# Patient Record
Sex: Male | Born: 1935 | Race: White | Hispanic: No | Marital: Married
Health system: Southern US, Community
[De-identification: ages and names within clinical notes are randomized; demographics above are authoritative.]

## PROBLEM LIST (undated history)

## (undated) DIAGNOSIS — G40909 Epilepsy, unspecified, not intractable, without status epilepticus: Secondary | ICD-10-CM

## (undated) DIAGNOSIS — I701 Atherosclerosis of renal artery: Secondary | ICD-10-CM

## (undated) DIAGNOSIS — I10 Essential (primary) hypertension: Secondary | ICD-10-CM

## (undated) DIAGNOSIS — E119 Type 2 diabetes mellitus without complications: Secondary | ICD-10-CM

## (undated) DIAGNOSIS — N183 Chronic kidney disease, stage 3 unspecified: Secondary | ICD-10-CM

## (undated) DIAGNOSIS — E785 Hyperlipidemia, unspecified: Secondary | ICD-10-CM

## (undated) DIAGNOSIS — Z794 Long term (current) use of insulin: Secondary | ICD-10-CM

## (undated) DIAGNOSIS — IMO0001 Reserved for inherently not codable concepts without codable children: Secondary | ICD-10-CM

## (undated) DIAGNOSIS — N400 Enlarged prostate without lower urinary tract symptoms: Secondary | ICD-10-CM

---

## 2005-05-07 ENCOUNTER — Ambulatory Visit: Payer: Self-pay | Admitting: Gastroenterology

## 2006-03-12 ENCOUNTER — Ambulatory Visit: Payer: Self-pay | Admitting: Internal Medicine

## 2006-04-06 ENCOUNTER — Ambulatory Visit: Payer: Self-pay | Admitting: Internal Medicine

## 2006-05-24 ENCOUNTER — Ambulatory Visit: Payer: Self-pay | Admitting: Urology

## 2006-06-07 ENCOUNTER — Ambulatory Visit: Payer: Self-pay | Admitting: Psychiatry

## 2007-02-10 ENCOUNTER — Inpatient Hospital Stay: Payer: Self-pay | Admitting: Internal Medicine

## 2007-02-10 ENCOUNTER — Other Ambulatory Visit: Payer: Self-pay

## 2007-05-09 ENCOUNTER — Ambulatory Visit: Payer: Self-pay | Admitting: Internal Medicine

## 2007-05-18 ENCOUNTER — Ambulatory Visit: Payer: Self-pay | Admitting: Vascular Surgery

## 2007-07-12 ENCOUNTER — Ambulatory Visit: Payer: Self-pay | Admitting: Internal Medicine

## 2012-02-20 ENCOUNTER — Inpatient Hospital Stay: Payer: Self-pay | Admitting: Internal Medicine

## 2012-02-20 ENCOUNTER — Ambulatory Visit: Payer: Self-pay | Admitting: Urology

## 2012-02-20 LAB — URINALYSIS, COMPLETE
Bacteria: NONE SEEN
Leukocyte Esterase: NEGATIVE
Nitrite: NEGATIVE
Ph: 5 (ref 4.5–8.0)
RBC,UR: 46 /HPF (ref 0–5)
Squamous Epithelial: NONE SEEN

## 2012-02-20 LAB — CBC
HGB: 14.9 g/dL (ref 13.0–18.0)
MCV: 90 fL (ref 80–100)
RDW: 13.4 % (ref 11.5–14.5)

## 2012-02-20 LAB — COMPREHENSIVE METABOLIC PANEL
Albumin: 3.9 g/dL (ref 3.4–5.0)
Alkaline Phosphatase: 98 U/L (ref 50–136)
Anion Gap: 9 (ref 7–16)
BUN: 57 mg/dL — ABNORMAL HIGH (ref 7–18)
Bilirubin,Total: 0.9 mg/dL (ref 0.2–1.0)
Calcium, Total: 8.9 mg/dL (ref 8.5–10.1)
Chloride: 98 mmol/L (ref 98–107)
EGFR (Non-African Amer.): 31 — ABNORMAL LOW
Glucose: 336 mg/dL — ABNORMAL HIGH (ref 65–99)
Potassium: 4.5 mmol/L (ref 3.5–5.1)
Sodium: 135 mmol/L — ABNORMAL LOW (ref 136–145)
Total Protein: 8 g/dL (ref 6.4–8.2)

## 2012-02-20 LAB — TROPONIN I
Troponin-I: 0.08 ng/mL — ABNORMAL HIGH
Troponin-I: 0.07 ng/mL — ABNORMAL HIGH

## 2012-02-21 LAB — CBC WITH DIFFERENTIAL/PLATELET
Basophil #: 0 10*3/uL (ref 0.0–0.1)
Basophil %: 0.4 %
Eosinophil #: 0 10*3/uL (ref 0.0–0.7)
Eosinophil %: 0.3 %
HGB: 11.4 g/dL — ABNORMAL LOW (ref 13.0–18.0)
Lymphocyte #: 0.3 10*3/uL — ABNORMAL LOW (ref 1.0–3.6)
Lymphocyte %: 5.1 %
MCH: 31.5 pg (ref 26.0–34.0)
MCHC: 35.1 g/dL (ref 32.0–36.0)
MCV: 90 fL (ref 80–100)
Monocyte %: 11.7 %
Neutrophil #: 5 10*3/uL (ref 1.4–6.5)
Neutrophil %: 82.5 %
RBC: 3.63 10*6/uL — ABNORMAL LOW (ref 4.40–5.90)
RDW: 13.7 % (ref 11.5–14.5)
WBC: 6.1 10*3/uL (ref 3.8–10.6)

## 2012-02-21 LAB — LIPID PANEL
Cholesterol: 172 mg/dL (ref 0–200)
HDL Cholesterol: 32 mg/dL — ABNORMAL LOW (ref 40–60)
Ldl Cholesterol, Calc: 92 mg/dL (ref 0–100)
Triglycerides: 238 mg/dL — ABNORMAL HIGH (ref 0–200)
VLDL Cholesterol, Calc: 48 mg/dL — ABNORMAL HIGH (ref 5–40)

## 2012-02-21 LAB — CK TOTAL AND CKMB (NOT AT ARMC)
CK, Total: 187 U/L (ref 35–232)
CK, Total: 201 U/L (ref 35–232)
CK-MB: 1.8 ng/mL (ref 0.5–3.6)

## 2012-02-21 LAB — BASIC METABOLIC PANEL
Chloride: 105 mmol/L (ref 98–107)
EGFR (African American): 33 — ABNORMAL LOW
Glucose: 198 mg/dL — ABNORMAL HIGH (ref 65–99)
Osmolality: 298 (ref 275–301)

## 2012-02-22 LAB — CBC WITH DIFFERENTIAL/PLATELET
Basophil #: 0 10*3/uL (ref 0.0–0.1)
Basophil %: 0.6 %
Eosinophil #: 0.1 10*3/uL (ref 0.0–0.7)
Eosinophil %: 2.1 %
HCT: 30.2 % — ABNORMAL LOW (ref 40.0–52.0)
HGB: 10.8 g/dL — ABNORMAL LOW (ref 13.0–18.0)
Lymphocyte #: 0.3 10*3/uL — ABNORMAL LOW (ref 1.0–3.6)
Lymphocyte %: 7 %
MCH: 32.2 pg (ref 26.0–34.0)
MCV: 90 fL (ref 80–100)
Monocyte #: 0.5 x10 3/mm (ref 0.2–1.0)
Neutrophil %: 78.3 %
RDW: 13.6 % (ref 11.5–14.5)

## 2012-02-22 LAB — BASIC METABOLIC PANEL
BUN: 38 mg/dL — ABNORMAL HIGH (ref 7–18)
Chloride: 109 mmol/L — ABNORMAL HIGH (ref 98–107)
EGFR (African American): 56 — ABNORMAL LOW
EGFR (Non-African Amer.): 49 — ABNORMAL LOW
Glucose: 179 mg/dL — ABNORMAL HIGH (ref 65–99)
Potassium: 3.5 mmol/L (ref 3.5–5.1)
Sodium: 141 mmol/L (ref 136–145)

## 2012-02-23 LAB — URINE CULTURE

## 2012-02-26 LAB — CULTURE, BLOOD (SINGLE)

## 2012-03-03 ENCOUNTER — Ambulatory Visit: Payer: Self-pay | Admitting: Urology

## 2012-03-07 ENCOUNTER — Ambulatory Visit: Payer: Self-pay | Admitting: Urology

## 2013-08-04 ENCOUNTER — Inpatient Hospital Stay: Payer: Self-pay | Admitting: Internal Medicine

## 2013-08-04 LAB — CBC WITH DIFFERENTIAL/PLATELET
Basophil #: 0 10*3/uL (ref 0.0–0.1)
HCT: 33.4 % — ABNORMAL LOW (ref 40.0–52.0)
HGB: 11.8 g/dL — ABNORMAL LOW (ref 13.0–18.0)
Lymphocyte #: 0.3 10*3/uL — ABNORMAL LOW (ref 1.0–3.6)
MCH: 31.3 pg (ref 26.0–34.0)
MCV: 89 fL (ref 80–100)
Monocyte %: 16.3 %
Platelet: 137 10*3/uL — ABNORMAL LOW (ref 150–440)
RBC: 3.77 10*6/uL — ABNORMAL LOW (ref 4.40–5.90)
WBC: 4.7 10*3/uL (ref 3.8–10.6)

## 2013-08-04 LAB — URINALYSIS, COMPLETE
Bacteria: NONE SEEN
Blood: NEGATIVE
Glucose,UR: NEGATIVE mg/dL (ref 0–75)
Leukocyte Esterase: NEGATIVE
Ph: 5 (ref 4.5–8.0)
Protein: >=500
RBC,UR: 3 /HPF (ref 0–5)
Specific Gravity: 1.02 (ref 1.003–1.030)
Squamous Epithelial: <1
WBC UR: 7 /HPF (ref 0–5)

## 2013-08-04 LAB — COMPREHENSIVE METABOLIC PANEL
Albumin: 3.3 g/dL — ABNORMAL LOW (ref 3.4–5.0)
Alkaline Phosphatase: 104 U/L (ref 50–136)
Anion Gap: 8 (ref 7–16)
BUN: 30 mg/dL — ABNORMAL HIGH (ref 7–18)
Bilirubin,Total: 0.7 mg/dL (ref 0.2–1.0)
Calcium, Total: 8.6 mg/dL (ref 8.5–10.1)
Chloride: 104 mmol/L (ref 98–107)
Creatinine: 1.71 mg/dL — ABNORMAL HIGH (ref 0.60–1.30)
EGFR (African American): 42 — ABNORMAL LOW
Glucose: 201 mg/dL — ABNORMAL HIGH (ref 65–99)
Osmolality: 284 (ref 275–301)
Potassium: 4.7 mmol/L (ref 3.5–5.1)
SGPT (ALT): 27 U/L (ref 12–78)
Total Protein: 6.8 g/dL (ref 6.4–8.2)

## 2013-08-04 LAB — PROTIME-INR
INR: 1.1
Prothrombin Time: 14.4 secs (ref 11.5–14.7)

## 2013-08-04 LAB — TROPONIN I: Troponin-I: <0.02 ng/mL

## 2013-08-05 LAB — CBC WITH DIFFERENTIAL/PLATELET
Eosinophil %: 0.7 %
HCT: 32.8 % — ABNORMAL LOW (ref 40.0–52.0)
HGB: 11.7 g/dL — ABNORMAL LOW (ref 13.0–18.0)
Lymphocyte %: 8.3 %
MCHC: 35.5 g/dL (ref 32.0–36.0)
MCV: 88 fL (ref 80–100)
Monocyte #: 0.6 x10 3/mm (ref 0.2–1.0)
Monocyte %: 14.6 %
Neutrophil #: 3 10*3/uL (ref 1.4–6.5)
Neutrophil %: 75.9 %
Platelet: 146 10*3/uL — ABNORMAL LOW (ref 150–440)
RBC: 3.75 10*6/uL — ABNORMAL LOW (ref 4.40–5.90)
RDW: 13.7 % (ref 11.5–14.5)
WBC: 3.9 10*3/uL (ref 3.8–10.6)

## 2013-08-05 LAB — BASIC METABOLIC PANEL
Anion Gap: 8 (ref 7–16)
BUN: 29 mg/dL — ABNORMAL HIGH (ref 7–18)
Calcium, Total: 8.6 mg/dL (ref 8.5–10.1)
Chloride: 104 mmol/L (ref 98–107)
EGFR (African American): 45 — ABNORMAL LOW
EGFR (Non-African Amer.): 38 — ABNORMAL LOW
Glucose: 73 mg/dL (ref 65–99)
Osmolality: 278 (ref 275–301)
Potassium: 3.7 mmol/L (ref 3.5–5.1)
Sodium: 137 mmol/L (ref 136–145)

## 2013-08-09 LAB — CULTURE, BLOOD (SINGLE)

## 2014-02-12 ENCOUNTER — Inpatient Hospital Stay: Payer: Self-pay | Admitting: Internal Medicine

## 2014-02-12 LAB — CBC WITH DIFFERENTIAL/PLATELET
BANDS NEUTROPHIL: 3 %
BASOS ABS: 1 %
EOS PCT: 3 %
HCT: 25.7 % — ABNORMAL LOW (ref 40.0–52.0)
HGB: 8.3 g/dL — ABNORMAL LOW (ref 13.0–18.0)
Lymphocytes: 4 %
MCH: 31.5 pg (ref 26.0–34.0)
MCHC: 32.5 g/dL (ref 32.0–36.0)
MCV: 97 fL (ref 80–100)
Monocytes: 2 %
Platelet: 191 10*3/uL (ref 150–440)
RBC: 2.65 10*6/uL — ABNORMAL LOW (ref 4.40–5.90)
RDW: 17.3 % — ABNORMAL HIGH (ref 11.5–14.5)
Segmented Neutrophils: 87 %
WBC: 5 10*3/uL (ref 3.8–10.6)

## 2014-02-12 LAB — URINALYSIS, COMPLETE
Bacteria: NONE SEEN
Bilirubin,UR: NEGATIVE
Blood: NEGATIVE
GLUCOSE, UR: NEGATIVE mg/dL (ref 0–75)
Ketone: NEGATIVE
Leukocyte Esterase: NEGATIVE
Nitrite: NEGATIVE
Ph: 5 (ref 4.5–8.0)
Protein: 100
RBC,UR: 4 /HPF (ref 0–5)
Specific Gravity: 1.016 (ref 1.003–1.030)
WBC UR: 1 /HPF (ref 0–5)

## 2014-02-12 LAB — COMPREHENSIVE METABOLIC PANEL
ALBUMIN: 3.5 g/dL (ref 3.4–5.0)
ANION GAP: 6 — AB (ref 7–16)
Alkaline Phosphatase: 107 U/L
BUN: 29 mg/dL — AB (ref 7–18)
Bilirubin,Total: 0.7 mg/dL (ref 0.2–1.0)
Calcium, Total: 8.8 mg/dL (ref 8.5–10.1)
Chloride: 111 mmol/L — ABNORMAL HIGH (ref 98–107)
Co2: 25 mmol/L (ref 21–32)
Creatinine: 1.77 mg/dL — ABNORMAL HIGH (ref 0.60–1.30)
EGFR (African American): 40 — ABNORMAL LOW
GFR CALC NON AF AMER: 34 — AB
GLUCOSE: 124 mg/dL — AB (ref 65–99)
Osmolality: 290 (ref 275–301)
Potassium: 4.3 mmol/L (ref 3.5–5.1)
SGOT(AST): 19 U/L (ref 15–37)
SGPT (ALT): 26 U/L (ref 12–78)
Sodium: 142 mmol/L (ref 136–145)
TOTAL PROTEIN: 6.9 g/dL (ref 6.4–8.2)

## 2014-02-12 LAB — HEMOGLOBIN A1C: Hemoglobin A1C: 5.7 % (ref 4.2–6.3)

## 2014-02-13 LAB — BASIC METABOLIC PANEL
ANION GAP: 4 — AB (ref 7–16)
BUN: 29 mg/dL — AB (ref 7–18)
CALCIUM: 8.3 mg/dL — AB (ref 8.5–10.1)
CHLORIDE: 110 mmol/L — AB (ref 98–107)
CO2: 26 mmol/L (ref 21–32)
CREATININE: 1.67 mg/dL — AB (ref 0.60–1.30)
EGFR (African American): 43 — ABNORMAL LOW
EGFR (Non-African Amer.): 37 — ABNORMAL LOW
Glucose: 86 mg/dL (ref 65–99)
OSMOLALITY: 285 (ref 275–301)
Potassium: 4.6 mmol/L (ref 3.5–5.1)
Sodium: 140 mmol/L (ref 136–145)

## 2014-02-13 LAB — CBC WITH DIFFERENTIAL/PLATELET
Basophil #: 0 10*3/uL (ref 0.0–0.1)
Basophil %: 0.4 %
EOS ABS: 0.2 10*3/uL (ref 0.0–0.7)
Eosinophil %: 3.3 %
HCT: 24.1 % — AB (ref 40.0–52.0)
HGB: 8.2 g/dL — ABNORMAL LOW (ref 13.0–18.0)
LYMPHS PCT: 3.7 %
Lymphocyte #: 0.2 10*3/uL — ABNORMAL LOW (ref 1.0–3.6)
MCH: 33 pg (ref 26.0–34.0)
MCHC: 34 g/dL (ref 32.0–36.0)
MCV: 97 fL (ref 80–100)
MONO ABS: 0.5 x10 3/mm (ref 0.2–1.0)
MONOS PCT: 8.5 %
Neutrophil #: 5.1 10*3/uL (ref 1.4–6.5)
Neutrophil %: 84.1 %
Platelet: 163 10*3/uL (ref 150–440)
RBC: 2.48 10*6/uL — AB (ref 4.40–5.90)
RDW: 16.7 % — ABNORMAL HIGH (ref 11.5–14.5)
WBC: 6 10*3/uL (ref 3.8–10.6)

## 2014-02-14 LAB — BASIC METABOLIC PANEL
Anion Gap: 6 — ABNORMAL LOW (ref 7–16)
BUN: 40 mg/dL — AB (ref 7–18)
CALCIUM: 7.6 mg/dL — AB (ref 8.5–10.1)
Chloride: 108 mmol/L — ABNORMAL HIGH (ref 98–107)
Co2: 23 mmol/L (ref 21–32)
Creatinine: 1.91 mg/dL — ABNORMAL HIGH (ref 0.60–1.30)
EGFR (African American): 36 — ABNORMAL LOW
EGFR (Non-African Amer.): 31 — ABNORMAL LOW
Glucose: 150 mg/dL — ABNORMAL HIGH (ref 65–99)
OSMOLALITY: 286 (ref 275–301)
Potassium: 4.3 mmol/L (ref 3.5–5.1)
SODIUM: 137 mmol/L (ref 136–145)

## 2014-02-14 LAB — CBC WITH DIFFERENTIAL/PLATELET
Basophil #: 0 10*3/uL (ref 0.0–0.1)
Basophil %: 0.3 %
EOS ABS: 0.1 10*3/uL (ref 0.0–0.7)
EOS PCT: 2.5 %
HCT: 20.3 % — ABNORMAL LOW (ref 40.0–52.0)
HGB: 6.9 g/dL — AB (ref 13.0–18.0)
LYMPHS PCT: 3.4 %
Lymphocyte #: 0.2 10*3/uL — ABNORMAL LOW (ref 1.0–3.6)
MCH: 32.9 pg (ref 26.0–34.0)
MCHC: 33.7 g/dL (ref 32.0–36.0)
MCV: 97 fL (ref 80–100)
MONOS PCT: 9.9 %
Monocyte #: 0.6 x10 3/mm (ref 0.2–1.0)
NEUTROS ABS: 4.7 10*3/uL (ref 1.4–6.5)
Neutrophil %: 83.9 %
PLATELETS: 131 10*3/uL — AB (ref 150–440)
RBC: 2.09 10*6/uL — ABNORMAL LOW (ref 4.40–5.90)
RDW: 16.1 % — ABNORMAL HIGH (ref 11.5–14.5)
WBC: 5.6 10*3/uL (ref 3.8–10.6)

## 2014-02-14 LAB — URINE CULTURE

## 2014-02-15 LAB — HEMOGLOBIN: HGB: 7.5 g/dL — AB (ref 13.0–18.0)

## 2014-02-16 ENCOUNTER — Encounter: Payer: Self-pay | Admitting: Internal Medicine

## 2014-02-22 LAB — CBC WITH DIFFERENTIAL/PLATELET
BASOS PCT: 0.8 %
Basophil #: 0 10*3/uL (ref 0.0–0.1)
Eosinophil #: 0.1 10*3/uL (ref 0.0–0.7)
Eosinophil %: 4.7 %
HCT: 26.6 % — ABNORMAL LOW (ref 40.0–52.0)
HGB: 8.7 g/dL — AB (ref 13.0–18.0)
LYMPHS PCT: 8.3 %
Lymphocyte #: 0.2 10*3/uL — ABNORMAL LOW (ref 1.0–3.6)
MCH: 30.4 pg (ref 26.0–34.0)
MCHC: 32.7 g/dL (ref 32.0–36.0)
MCV: 93 fL (ref 80–100)
MONO ABS: 0.3 x10 3/mm (ref 0.2–1.0)
MONOS PCT: 14.4 %
NEUTROS PCT: 71.8 %
Neutrophil #: 1.7 10*3/uL (ref 1.4–6.5)
Platelet: 159 10*3/uL (ref 150–440)
RBC: 2.87 10*6/uL — ABNORMAL LOW (ref 4.40–5.90)
RDW: 17.2 % — AB (ref 11.5–14.5)
WBC: 2.4 10*3/uL — ABNORMAL LOW (ref 3.8–10.6)

## 2014-02-22 LAB — BASIC METABOLIC PANEL
Anion Gap: 6 — ABNORMAL LOW (ref 7–16)
BUN: 31 mg/dL — AB (ref 7–18)
CO2: 27 mmol/L (ref 21–32)
CREATININE: 1.52 mg/dL — AB (ref 0.60–1.30)
Calcium, Total: 8.7 mg/dL (ref 8.5–10.1)
Chloride: 108 mmol/L — ABNORMAL HIGH (ref 98–107)
EGFR (Non-African Amer.): 41 — ABNORMAL LOW
GFR CALC AF AMER: 48 — AB
GLUCOSE: 82 mg/dL (ref 65–99)
Osmolality: 287 (ref 275–301)
Potassium: 4.2 mmol/L (ref 3.5–5.1)
SODIUM: 141 mmol/L (ref 136–145)

## 2014-02-22 LAB — HEMOGLOBIN A1C: HEMOGLOBIN A1C: 5.2 % (ref 4.2–6.3)

## 2014-02-23 ENCOUNTER — Encounter: Payer: Self-pay | Admitting: Internal Medicine

## 2015-02-15 NOTE — H&P (Signed)
PATIENT NAME:  Juan Beck, Juan Beck MR#:  [id number] DATE OF BIRTH:  Jan 06, 1926  DATE OF ADMISSION:  08/04/2013  PRIMARY DOCTOR:  Dr. Frazier Richards.  ER PHYSICIAN:  Dr. Lurline Hare.   CHIEF COMPLAINT:  Dizziness.   HISTORY OF PRESENT ILLNESS: The patient is an 81 year old male patient who came in because of a syncopal episode. The patient had a tooth extraction from the right lower molar 2 days ago. The patient noticed a worsening right-sided facial swelling so he was given amoxicillin. , when he had like an episode of fall last night, associated with generalized weakness. The wife could not make him stand and the patient had dizziness. Because of the weakness, dizziness and also near syncopal episode, the patient's family brought him here. In the ER, the patient was orthostatic. The patient had orthostatic hypotension changes. Blood pressure dropped to 70/40s when he stood up, and while sitting, his blood pressure was 123/62 so the ER physician recommended admission because of a facial cellulitis and orthostatic hypotension. Because of his history of a stent and he is on aspirin, Plavix, if he goes home and if he falls, he can have intracranial bleed, and then because of that and also dehydration with cellulitis, we are going to admit. The patient really feels better than when he came and he wants to go home, but he still had orthostatic changes. According to family this not new, he does have orthostatic hypotension before, never seeked medical attention for that.   PAST MEDICAL HISTORY:  Significant for hypertension, diabetes, and  history of renal artery stent placement.   ALLERGIES:  No known allergies.   SOCIAL HISTORY:  No smoking. No drinking. Lives with the wife.   FAMILY HISTORY:  Significant for hypertension two brothers and sisters.   PAST SURGICAL HISTORY:  Significant for renal artery stent placement and also a history of ureteral stent placement. The patient was admitted in May of last  year and had a ureteral stent placement for hydronephrosis and also ureteral canaliculi. The patient had left-sided ureteral lithiasis so he had cystoscopy and Dr. Allen Norris saw the patient at that time.   MEDICATIONS:  He is on:  1.  Amlodipine 10 mg daily.  2.  Aspirin 81 mg daily.  3.  Dilantin 100 mg, 2 tablets at bedtime.  4.  Levophed 5 mg p.o. b.i.d.  5.  Labetalol 300 mg p.o. b.i.d.  6.  Lantus 30 units at bedtime.  7.  Metformin 1 gram p.o. b.i.d.  8.  Plavix 75 mg p.o. daily.   REVIEW OF SYSTEMS:  CONSTITUTIONAL:  Had a low-grade temp this morning and he   but feels better now.  EYES:  No blurred vision.  ENT:  No tinnitus. No epistaxis. The patient has some difficulty with the swallow because of the tooth extraction. The patient also has some gum pain in the right lower molar area.  RESPIRATORY:  No cough. No wheezing.  CARDIOVASCULAR:  No chest pain. No orthopnea.  GASTROINTESTINAL:  No nausea. No vomiting. No abdominal pain.  GENITOURINARY:  No dysuria.  HEMATOLOGIC:   No anemia.  SKIN AND MUSCULOSKELETAL:  No joint pain.  NEUROLOGIC:  No numbness or weakness.  PSYCHIATRIC:  No anxiety or insomnia.   PHYSICAL EXAMINATION:  VITAL SIGNS:  Temperature 99.2, heart rate 73, blood pressure 123/62, Orthostatic vitals: 77/40 when he stood up.  GENERAL:  Awake, alert, oriented. Answering questions appropriately. HEENT:  Normocephalic. Pupils equally reacting to light. Extraocular movements are intact.  NOSE:  No turbinate hypertrophy.  EARS:  No tympanic membrane congestion. MOUTH:  The patient has very poor dentition and also extracted place in the right molar area, looks clean. No obvious swelling and no obvious tenderness observed.  NECK:  Supple. No JVD. Normal range of motion.  RESPIRATORY:  Clear to auscultation. No wheeze. No rales.  CARDIOVASCULAR:  S1, S2 regular. No murmurs and the patient's pulses are equal in femoral and dorsalis pedis. No peripheral edema.   GASTROINTESTINAL:  Abdomen is soft, nontender, nondistended. Bowel sounds present. No organomegaly,  MUSCULOSKELETAL:  Strength 5/5 in upper and lower extremities.  NEUROLOGIC:  Alert, awake, oriented. Cranial nerves II through XII intact. Power 5/5 in upper and lower extremities.  PSYCHIATRIC:  Mood and affect are within normal limits.   LABORATORY, DIAGNOSTIC AND RADIOLOGICAL DATA:  CT of the maxillofacial area shows right facial tissue swelling overlying the maxilla without any abscess.   X-ray of the right ankle showed no acute changes. Right knee x-ray degenerative changes. UA is amber in color urine.  CT head shows no acute intracranial abnormalities.   WBC 4.7, hemoglobin 11.8, hematocrit 33.4, platelets 137.   ELECTROLYTES:  Sodium is 136, potassium 4.7, chloride 104, bicarb 24, BUN 30, creatinine 1.7 and glucose 201.  INR is 1.1. Dilantin is 4.1.   ASSESSMENT AND PLAN:  This is an 81 year old male with an episode of syncope secondary to orthostatic hypotension, acute renal failure.  1.  Admit to the hospitalist service. Continue IV fluids and monitor overnight.  2.  Seizure disorder. Continue Dilantin.  3.  A history of hypertension. Continue amlodipine and labetalol. Orthostatic vitals to be checked.  4.  Diabetes mellitus type 2. Continue Lantus and metformin. Watch creatinine closely.  5.  A history of renal artery stent placement. He is on Plavix and aspirin. Continue that.  6.  Right facial cellulitis. The patient will get IV Zosyn today, but probably can go home with Levaquin tomorrow, and watch for temperature curve and transfer service to Dr. Frazier Richards.   TIME SPENT:  More than 55 minutes.   ____________________________ Epifanio Lesches, MD sk:jm D: 08/04/2013 17:26:56 ET T: 08/04/2013 17:55:27 ET JOB#: [id number]  cc: Epifanio Lesches, MD, <Dictator> Epifanio Lesches MD ELECTRONICALLY SIGNED 09/07/2013 22:39

## 2015-02-15 NOTE — Discharge Summary (Signed)
PATIENT NAME:  Juan Beck, Juan Beck MR#:  [id number] DATE OF BIRTH:  06-03-1926  DATE OF ADMISSION:  08/04/2013 DATE OF DISCHARGE:  08/08/2013  DISCHARGE DIAGNOSES: 1. Facial cellulitis.  2. Herpes zoster.  3. Type 2 diabetes.  4. Recent dental procedure.  5. Hypertension with orthostatic changes.   DISCHARGE MEDICATIONS: Per [facility] med reconciliation, please see that sheet that was completed for details. Basically, his amlodipine was stopped. His labetalol was decreased to 100 t.i.d. and he was given Augmentin and famir for a week.   HISTORY AND PHYSICAL: Please see detailed history and physical on admission.   HOSPITAL COURSE: The patient was admitted with a rash that is tender and swollen on the right side of face. He had dental procedures done fairly recently. There was some vesicular component to this and a mild amount of pain. He was treated with IV Zosyn and Famvir was started. With this treatment he improved. Minimal erythema at this point, certainly in intermittent areas at this point as well. Further evaluation showed some controlled blood glucose from 60 to approximately 160, not always fasting. He has stable creatinine at approximately 1.6. His CBC was relatively normal. Head CT was done on admission that showed no acute processes. Maxillofacial CT was also done, it did not show a loculated mass, but showed some soft tissue swelling. Chest x-ray showed no acute disease as well. Blood cultures were negative. Troponins were normal. He will discharge home. Will follow up soon to re-evaluate his facial lesion. He knows to call for any details. He was much less dizzy with a higher blood pressure, had been falling and off balance on his regimen that controlled that. We will have to let him run high given, likely, diffuse atherosclerosis and orthostatic changes and inability to correctly measure his true intra-arterial blood pressure given all that.   TIME SPENT: Approximately 35 minutes doing all  his discharge tasks today.   ____________________________ Ocie Cornfield. Ouida Sills, MD mwa:sg D: 08/07/2013 08:12:04 ET T: 08/07/2013 08:36:17 ET JOB#: [id number]  cc: Ocie Cornfield. Ouida Sills, MD, <Dictator> Kirk Ruths MD ELECTRONICALLY SIGNED 08/07/2013 16:48

## 2015-02-16 NOTE — Op Note (Signed)
PATIENT NAME:  Juan Beck, Juan Beck MR#:  [id number] DATE OF BIRTH:  Mar 02, 1926  DATE OF PROCEDURE:  02/13/2014  PREOPERATIVE DIAGNOSIS: Hairline fracture  right femoral neck.   PREOPERATIVE DIAGNOSIS: Hairline fracture  right femoral neck.   OPERATION: Percutaneous pinning of the right femoral neck fracture.   SURGEON: Park Breed, M.D.   ANESTHESIA: General endotracheal.   COMPLICATIONS: None.   DRAINS: None.   ESTIMATED BLOOD LOSS: Minimal.   REPLACED: None.   DESCRIPTION OF PROCEDURE: The patient was brought to the Operating Room where he underwent satisfactory general endotracheal anesthesia in the supine position on a fracture table. Due to the fact the patient takes Plavix a spinal could not be done. The left leg was flexed and abducted. The right leg was placed in gentle internal rotation. Fluoroscopy showed the fracture  remained in excellent alignment. The hip was prepped and draped in sterile fashion, and four stab wounds were made.  Four guide pins were inserted into the head and neck to appropriate the depth in the head. The  lateral cortex was then drilled for these and a 90 mm, 95 mm and two 100 mm 7.3 cannulated Synthes screws were inserted to fix the fracture securely. The pins were removed. Fluoroscopy showed good positioning of all the hardware. The stab wounds were closed with 3-0 nylon sutures. Dry sterile dressing was applied. The patient was taken out of traction, had good range of motion. He was transferred to his hospital bed and taken to recovery in good condition.    ____________________________ Park Breed, MD hem:sg D: 02/13/2014 14:14:43 ET T: 02/13/2014 14:31:47 ET JOB#: [id number]  cc: Park Breed, MD, <Dictator> Park Breed MD ELECTRONICALLY SIGNED 02/13/2014 16:19

## 2015-02-16 NOTE — Discharge Summary (Signed)
Dates of Admission and Diagnosis:  Date of Admission 12-Feb-2014   Date of Discharge 16-Feb-2014   Admitting Diagnosis hip pain   Final Diagnosis same and htn   Discharge Diagnosis 1 arf on ckd III    Chief Complaint/History of Present Illness per h and p   Allergies:  No Known Allergies:   Hepatic:  20-Apr-15 08:08   Bilirubin, Total 0.7  Alkaline Phosphatase 107 (45-117 NOTE: New Reference Range 09/15/13)  SGPT (ALT) 26  SGOT (AST) 19  Total Protein, Serum 6.9  Albumin, Serum 3.5  Routine BB:  20-Apr-15 08:08   ABO Group + Rh Type O Positive  Antibody Screen NEGATIVE (Result(s) reported on 14 Feb 2014 at 10:00AM.)  Crossmatch Unit 1 Transfused  Result(s) reported on 15 Feb 2014 at 07:14AM.  Routine Micro:  20-Apr-15 17:26   Micro Text Report URINE CULTURE   COMMENT                   NO GROWTH IN 36 HOURS   ANTIBIOTIC                       Specimen Source CLEAN CATCH  Culture Comment NO GROWTH IN 36 HOURS  Result(s) reported on 14 Feb 2014 at 09:59AM.  Cardiology:  20-Apr-15 08:08   Ventricular Rate 64  Atrial Rate 64  P-R Interval 146  QRS Duration 94  QT 448  QTc 462  P Axis 83  R Axis 19  T Axis 58  ECG interpretation Normal sinus rhythm Normal ECG When compared with ECG of 04-Aug-2013 15:22, Nonspecific T wave abnormality no longer evident in Anterior leads ----------unconfirmed---------- Confirmed by OVERREAD, NOT ([id number]), editor PEARSON, BARBARA ([id number]) on 02/14/2014 8:39:57 AM  Routine Chem:  20-Apr-15 08:08   Glucose, Serum  124  BUN  29  Creatinine (comp)  1.77  Sodium, Serum 142  Potassium, Serum 4.3  Chloride, Serum  111  CO2, Serum 25  Calcium (Total), Serum 8.8  Anion Gap  6  Osmolality (calc) 290  eGFR (African American)  40  eGFR (Non-African American)  34 (eGFR values <85mL/min/1.73 m2 may be an indication of chronic kidney disease (CKD). Calculated eGFR is useful in patients with stable renal function. The eGFR calculation will  not be reliable in acutely ill patients when serum creatinine is changing rapidly. It is not useful in  patients on dialysis. The eGFR calculation may not be applicable to patients at the low and high extremes of body sizes, pregnant women, and vegetarians.)    08:09   Hemoglobin A1c ([facility]) 5.7 (The American Diabetes Association recommends that a primary goal of therapy should be <7% and that physicians should reevaluate the treatment regimen in patients with HbA1c values consistently >8%.)  21-Apr-15 05:52   Glucose, Serum 86  BUN  29  Creatinine (comp)  1.67  Sodium, Serum 140  Potassium, Serum 4.6  Chloride, Serum  110  CO2, Serum 26  Calcium (Total), Serum  8.3  Anion Gap  4  Osmolality (calc) 285  eGFR (African American)  43  eGFR (Non-African American)  37 (eGFR values <20mL/min/1.73 m2 may be an indication of chronic kidney disease (CKD). Calculated eGFR is useful in patients with stable renal function. The eGFR calculation will not be reliable in acutely ill patients when serum creatinine is changing rapidly. It is not useful in  patients on dialysis. The eGFR calculation may not be applicable to patients at the low and high extremes  of body sizes, pregnant women, and vegetarians.)  22-Apr-15 06:09   Glucose, Serum  150  BUN  40  Creatinine (comp)  1.91  Sodium, Serum 137  Potassium, Serum 4.3  Chloride, Serum  108  CO2, Serum 23  Calcium (Total), Serum  7.6  Anion Gap  6  Osmolality (calc) 286  eGFR (African American)  36  eGFR (Non-African American)  31 (eGFR values <106mL/min/1.73 m2 may be an indication of chronic kidney disease (CKD). Calculated eGFR is useful in patients with stable renal function. The eGFR calculation will not be reliable in acutely ill patients when serum creatinine is changing rapidly. It is not useful in  patients on dialysis. The eGFR calculation may not be applicable to patients at the low and high extremes of body sizes,  pregnant women, and vegetarians.)  Routine UA:  20-Apr-15 17:26   Color (UA) Yellow  Clarity (UA) Clear  Glucose (UA) Negative  Bilirubin (UA) Negative  Ketones (UA) Negative  Specific Gravity (UA) 1.016  Blood (UA) Negative  pH (UA) 5.0  Protein (UA) 100 mg/dL  Nitrite (UA) Negative  Leukocyte Esterase (UA) Negative (Result(s) reported on 12 Feb 2014 at 05:43PM.)  RBC (UA) 4 /HPF  WBC (UA) 1 /HPF  Bacteria (UA) NONE SEEN  Epithelial Cells (UA) <1 /HPF  Mucous (UA) PRESENT  Hyaline Cast (UA) 4 /LPF (Result(s) reported on 12 Feb 2014 at 05:43PM.)  Routine Hem:  20-Apr-15 08:08   Hemoglobin (CBC)  8.3  WBC (CBC) 5.0  RBC (CBC)  2.65  Hematocrit (CBC)  25.7  Platelet Count (CBC) 191 (Result(s) reported on 12 Feb 2014 at 08:52AM.)  MCV 97  MCH 31.5  MCHC 32.5  RDW  17.3  Bands 3  Segmented Neutrophils 87  Lymphocytes 4  Monocytes 2  Eosinophil 3  Basophil 1  Diff Comment 1 ANISOCYTOSIS  Diff Comment 2 PLTS VARIED IN SIZE  Result(s) reported on 12 Feb 2014 at 08:52AM.  21-Apr-15 05:52   Hemoglobin (CBC)  8.2  WBC (CBC) 6.0  RBC (CBC)  2.48  Hematocrit (CBC)  24.1  Platelet Count (CBC) 163  MCV 97  MCH 33.0  MCHC 34.0  RDW  16.7  Neutrophil % 84.1  Lymphocyte % 3.7  Monocyte % 8.5  Eosinophil % 3.3  Basophil % 0.4  Neutrophil # 5.1  Lymphocyte #  0.2  Monocyte # 0.5  Eosinophil # 0.2  Basophil # 0.0 (Result(s) reported on 13 Feb 2014 at 06:30AM.)  22-Apr-15 06:09   Hemoglobin (CBC)  6.9  WBC (CBC) 5.6  RBC (CBC)  2.09  Hematocrit (CBC)  20.3  Platelet Count (CBC)  131  MCV 97  MCH 32.9  MCHC 33.7  RDW  16.1  Neutrophil % 83.9  Lymphocyte % 3.4  Monocyte % 9.9  Eosinophil % 2.5  Basophil % 0.3  Neutrophil # 4.7  Lymphocyte #  0.2  Monocyte # 0.6  Eosinophil # 0.1  Basophil # 0.0 (Result(s) reported on 14 Feb 2014 at 06:50AM.)  23-Apr-15 05:13   Hemoglobin (CBC)  7.5 (Result(s) reported on 15 Feb 2014 at 05:57AM.)   Pertinent Past  History:  Pertinent Past History per preivious   Hospital Course:  Hospital Course Hip dealth with per ortho, arf improve diwith ivf, anemia with two units total given, see labs. Going to [facility]   Condition on Discharge Satisfactory   DISCHARGE INSTRUCTIONS HOME MEDS:  Medication Reconciliation: Patient's Home Medications at Discharge:     Medication Instructions  dilantin  100 mg oral capsule  2 tab(s) orally once a day (at bedtime)    plavix tablet 75 mg  1 tab(s) orally once a day    lantus 100 units/ml subcutaneous solution  30 unit(s) subcutaneous once a day (at bedtime)   labetalol 100 mg oral tablet  1 tab(s) orally 2 times a day   zetia 10 mg oral tablet  1 tab(s) orally once a day   losartan 50 mg oral tablet  1 tab(s) orally once a day   glipizide 5 mg oral tablet  1 tab(s) orally    acetaminophen-hydrocodone 325 mg-5 mg oral tablet  1 tab(s) orally every 4 to 6 hours, As needed, pain   enoxaparin  30 milligram(s) subcutaneous once a day   insulin aspart 100 units/ml subcutaneous solution   subcutaneous    alendronate 70 mg oral tablet  1 tab(s) orally once a week   senna  1 tab(s) orally 2 times a day, As needed, constipation   cholecalciferol oral tablet  2000 unit(s) orally once a day    STOP TAKING THE FOLLOWING MEDICATION(S):    aspirin enteric coated tablet 81 mg: 1 tab(s) orally once a day   Physician's Instructions:  Diet Carbohydrate Controlled (ADA) Diet   Activity Limitations As tolerated  per ortho   Return to Work na   Time frame for Follow Up Appointment na   Electronic Signatures: Kirk Ruths (MD)  (Signed 24-Apr-15 08:15)  Authored: ADMISSION DATE AND DIAGNOSIS, CHIEF COMPLAINT/HPI, Allergies, PERTINENT LABS, PERTINENT PAST HISTORY, HOSPITAL COURSE, DISCHARGE INSTRUCTIONS HOME MEDS, PATIENT INSTRUCTIONS   Last Updated: 24-Apr-15 08:15 by Kirk Ruths (MD)

## 2015-02-16 NOTE — H&P (Signed)
PATIENT NAME:  Juan Beck, Juan Beck MR#:  [id number] DATE OF BIRTH:  Oct 17, 1926  DATE OF ADMISSION:  02/12/2014  PRIMARY CARE PHYSICIAN: Dr. Ouida Sills  HISTORY OF PRESENT ILLNESS: The patient is an 81 year old Caucasian male with past medical history significant for history of diabetes, hypertension, renal artery stenosis with stent placement in the past, history of TIA, gout, as well as CKD who presents to the hospital with complaints of fall and not being able to get up, also significant pain in the right hip. According to the patient, he was outside when he was turning around, suddenly slipped and fell down on the concrete. He had no loss of consciousness and no dizziness prior to fall. He however was not able to get up by himself because of significant pain. He was helped by his neighbor and was brought to the Emergency Room for further evaluation where he was noted to have right femoral neck fracture. He was also noted to be anemic with hemoglobin dropping down from 11.7, in October 2014, to 8.3 during this admission. He feels thirsty and in significant pain. His blood pressure is also very highly elevated above 200. Hospitalist services were contacted for admission.   PAST MEDICAL HISTORY: Significant for history of admission for facial cellulitis, herpes zoster, in October 2014, diabetes mellitus, hypertension, orthostatic hypotension, renal artery stenosis with stent placement in the past, lithotripsy in 2000, TIA, gout, and CKD. Also history of ureteroscopic ureterolithotomy in 2001, photovaporization of the prostate with green light laser in 2007, renal artery stent placement as mentioned above in 2008, and rhinoplasty in the distant past.   MEDICATIONS: Aspirin 81 mg p.o. daily, Dilantin 200 mg p.o. at bedtime, glipizide 5 mg p.o. twice daily, labetalol 100 mg twice daily, Lantus 30 units subcutaneously at bedtime, losartan 50 mg p.o. daily, Plavix 75 mg p.o. daily, Zetia 10 mg p.o. daily.    ALLERGIES: No known drug allergies.   SOCIAL HISTORY: No smoking or alcohol abuse. Lives with his wife.   FAMILY HISTORY: Hypertension in 2 brothers as well as sisters.   REVIEW OF SYSTEMS: Positive for pains in the right hip, weight fluctuation between 170 to 180, intermittently now 169, cataract bilaterally removal, some intermittent seasonal allergies, some sinus congestion intermittently.  CONSTITUTIONAL: Otherwise, no fevers or chills, fatigue, weakness, weight loss or gain. EYES: Denies blurry vision, double vision, glaucoma. ENT: Denies any tinnitus, allergies, epistaxis, sinus pain, dentures, difficulty swallowing.  RESPIRATORY: Denies any cough, wheezes, asthma, or COPD.  CARDIOVASCULAR: Denies any chest pain, orthopnea, arrhythmias, palpitations or syncope.  GASTROINTESTINAL: Denies nausea, vomiting, diarrhea or constipation.  GENITOURINARY: Denies dysuria, hematuria, frequency or incontinence. ENDOCRINOLOGY: Denies polydipsia, nocturia, thyroid problems, heat or cold intolerance or thirst. HEMATOLOGIC: Denies anemia, easy bruising, bleeding or swollen glands.  SKIN: Denies any acne, rashes, lesions or change in moles.  MUSCULOSKELETAL: Denies arthritis, cramps, swelling or gout. NEUROLOGIC: Denies numbness, epilepsy or tremors.  PSYCHIATRIC: Denies anxiety, insomnia.  PHYSICAL EXAMINATION: VITAL SIGNS:  On arrival to the hospital, temperature was 98.5, pulse 58, respiratory rate 18, blood pressure 132/60, and saturation 98% on room air.  GENERAL: This is a well-developed, well-nourished Caucasian male, somewhat uncomfortable, lying on the stretcher. He is also becoming emotional whenever talking about his stay in the hospital.  HEENT: His pupils are equal and reactive to light. Extraocular movements intact. No icterus or conjunctivitis. Has normal hearing. No pharyngeal erythema. Mucosa is very dry.  NECK: No masses. Supple and nontender. Thyroid not enlarged. No adenopathy,  JVD or carotid bruits bilateral. Full range of motion.  LUNGS: Clear to auscultation anteriorly. A few rhonchi were heard. Otherwise, no diminished breath sounds or wheezing. No labored inspirations, increased effort, dullness to percussion or overt respiratory distress.  HEART: S1 and S2 appreciated. Rhythm was regular. PMI not lateralized. Chest is nontender to palpation. 1+ pedal pulses. No lower extremity edema, calf tenderness, or cyanosis was noted.  ABDOMEN: Moderately firm. No tenderness was noted. No hepatosplenomegaly or masses were noted.  RECTAL: Deferred.  EXTREMITIES: Muscle strength: Able to move all extremities, except of right lower extremity. No cyanosis, degenerative joint disease. Not able to assess for kyphosis. Gait was not tested.  SKIN: Did not reveal any rashes, lesions, erythema, nodularity, induration. It was warm and dry to palpation.  LYMPHATIC: No adenopathy in the cervical region.  NEUROLOGIC: Cranial nerves grossly intact. Sensory is intact. No dysarthria or aphasia. The patient is alert and oriented to time, person and place, cooperative. Memory is somewhat impaired but no significant confusion, agitation. He seems to be depressed and somewhat tearful.   DIAGNOSTIC DATA: BMP revealed BUN and creatinine of 29 and 1.77, glucose 124, otherwise BMP was unremarkable. Liver enzymes were normal. CBC: White blood cell count was 5, hemoglobin 8.3, and platelet count 191,000. Coagulation panel was not checked.   EKG revealed normal sinus rhythm at 64 beats per minute, normal axis, no acute ST-T changes were noted.   The patient's right hip complete x-ray, 20th of April 2015, revealed nondisplaced cervical right femoral neck fracture.   ASSESSMENT AND PLAN: 1.  Right hip fracture. Admit the patient to the medical floor. The patient does have minimal risk factors such as diabetes mellitus, history of transient ischemic attack, hypertension, elderly age. We will continue  labetalol and the patient will be consulted by orthopedic surgeon who is planning hip pinning tomorrow. Will hold aspirin as well as Plavix for now.  2.  Malignant hypertension. Very likely driven by pain. Will resume home medications. Will also add labetalol as needed IV.  3.  Diabetes mellitus. We will continue glipizide, Lantus, and sliding scale insulin. Will hemoglobin A1c. 4.  Chronic kidney disease. The patient's chronic kidney disease seems to be slightly worse. We will continue IV fluids since he is a little dehydrated at low rate. Will follow the patient's creatinine in the morning.  5.  Anemia but no active bleeding was noted. The patient is on aspirin as well as Plavix. We will initiate PPIs, also get guaiac of stool and will transfuse if it is okay by hospital policy post procedure if we need to.   TIME SPENT: 50 minutes.  ____________________________ Theodoro Grist, MD rv:sb D: 02/12/2014 10:58:31 ET T: 02/12/2014 11:35:27 ET JOB#: [id number]  cc: Theodoro Grist, MD, <Dictator> Ocie Cornfield. Ouida Sills, MD Theodoro Grist MD ELECTRONICALLY SIGNED 02/24/2014 18:54

## 2015-02-16 NOTE — Consult Note (Signed)
Brief Consult Note: Diagnosis: Right femoral neck fracture.   Patient was seen by consultant.   Recommend to proceed with surgery or procedure.   Recommend further assessment or treatment.   Orders entered.   Discussed with Attending MD.   Comments: 81 year old male fell today in driveway and landed on right hip. Brought to Emergency Room where exam and X-rays show a non displaced fracture of the right femoral neck. Admitted for medical evaluation and surgery.  Discussed options with patient, wife, and daughters.  Risks and benefits of surgery were discussed at length including but not limited to infection, non union, nerve or blood vessed damage, non union, need for repeat surgery, blood clots and lung emboli, and death. Plan percutaneous pinning of hip tomorrow as he is on Plavix and EC ASA. hemoglobin level 8.3 and will watch this closely.   Exam: Alert and oriented.  circulation/sensation/motor function good but severe pain with range of motion of right hip. No shortening but rotates outward.  Skin intact   X-rays:  as above  Imp: non displaced right femoral neck fracture  Plan:  Percutaneous pinning tomorrow if stable.  Electronic Signatures: Park Breed (MD)  (Signed 20-Apr-15 14:24)  Authored: Brief Consult Note   Last Updated: 20-Apr-15 14:24 by Park Breed (MD)

## 2015-02-17 NOTE — Consult Note (Signed)
PATIENT NAME:  Juan Beck, Juan Beck MR#:  [id number] DATE OF BIRTH:  1926/01/09  DATE OF CONSULTATION:  02/20/2012  REFERRING PHYSICIAN:  Epifanio Lesches, MD CONSULTING PHYSICIAN:  Darcella Cheshire, MD  REASON FOR CONSULTATION: Obstructing left ureteral stone with refractory colic and acute renal insufficiency.  HISTORY OF PRESENT ILLNESS: The patient is an 81 year old Caucasian male with a history of recurrent urolithiasis. The patient is an established patient of Dr. Maryan Puls. The patient presented to the [facility] Emergency Department with a three day history of progressive abdominal pain radiating to the left flank, sharp and colicky in nature, but constant, rating a 9 out of 10 until this morning when it reached a 10 out of 10. The patient denies any dysuria, gross hematuria, fevers or chills. The patient does report nausea with anorexia with one episode of emesis two days prior to admission. The patient also reports that he has not voided for over 24 hours. In the Emergency Department, the patient was noted to have acute renal insufficiency with a creatinine of 1.96 with an estimated GFR of 31 with hyperglycemia at 336. A mild leukocytosis at 10.8 thousand. A CT scan of the abdomen and pelvis with oral contrast but no IV contrast revealed a 6 mm left ureteral stone with mild left hydronephrosis and bilateral nephrolithiasis. Given the difficulty to control pain with the acute renal insufficiency and hyperglycemia, the patient was admitted to the medicine service and urology consulted.   PAST MEDICAL HISTORY:  1. Diabetes mellitus for 30 years (insulin-dependent for the past eight years).  2. Hypertension for 40 years with some difficulty in control five years ago which improved after right renal artery stenting.  3. Benign prostatic hypertrophy with an elevated PSA with negative biopsies on multiple occasions.  4. Question transient ischemic attacks versus seizure activity.    PAST SURGICAL HISTORY:  1. Status post ureteral stenting.  2. Status post GreenLight Photoselective vaporization of the prostate with Holmium laser cystolitholapaxy in July 2007 with Dr. Maryan Puls.   FAMILY HISTORY: The patient denies any family history of urolithiasis but does report a family history of prostate cancer with his father being diagnosed at the age of 86 and succumbing to metastatic disease at the age of 42.   SOCIAL HISTORY: The patient denies any tobacco or alcohol use.   ALLERGIES: No known drug allergies.   MEDICATIONS:  1. Metformin.  2. Glipizide.  3. Lantus insulin.  4. Labetalol.  5. Norvasc.  6. Dilantin.  7. Plavix.  8. Aspirin.   REVIEW OF SYSTEMS: GENERAL: The patient denies any fever or chills. HEENT: The patient denies any recent upper respiratory infections. RESPIRATORY: The patient denies any shortness of breath. CARDIOVASCULAR: The patient does endorse some dyspnea on exertion, but denies any chest pain or angina. GASTROINTESTINAL: As per the history of present illness plus no bowel movements for the past three days. GENITOURINARY: As per the history of present illness. MUSCULOSKELETAL: The patient reports spinal stenosis with progressive difficulty with ambulation as well as chronic degenerative joint disease in the knees and shoulders bilaterally. SKIN: The patient denies any rashes or lesions. LYMPHATIC: The patient denies any adenopathy. NEUROLOGIC: The patient denies any lateralizing weakness. PSYCHIATRIC: The patient denies any anxiety or depression. HEMATOLOGIC: The patient reports easy bruisability on Plavix and aspirin, but no bleeding diathesis.   PHYSICAL EXAMINATION:  VITAL SIGNS: Temperature 98.1 degrees Fahrenheit, pulse 74 and regular, respiratory rate 16 and unlabored, blood pressure 150/75, oxygen saturation  98% on 2 liters.   GENERAL: Well-developed, well-nourished white male in no apparent distress.   SKIN: Warm and dry without  lesions about the head, face and neck.   HEENT: Normocephalic, atraumatic. Extraocular movements intact. Anicteric.   NECK: No masses or bruits.   CHEST: Clear to auscultation with normal respiratory effort.   CARDIOVASCULAR: Regular rate and rhythm without murmurs, gallops, or rubs. 2+ radial pulses bilaterally.   ABDOMEN: Nontender, nondistended abdomen with no costovertebral angle tenderness.   GENITOURINARY: Examination deferred to Dr. Yves Dill.   EXTREMITIES: No peripheral edema.   NEUROLOGIC: Nonfocal.   PSYCHIATRIC: Alert and oriented x4.   LABORATORY, RADIOLOGICAL AND DIAGNOSTIC DATA: Sodium 135 potassium 4.5, chloride 98, CO2 28, BUN 57, creatinine 1.96 with an estimated GFR of 31, glucose 336, calcium 8.9. LFTs within normal limits. Troponins 0.08 and 0.07. White blood cell count 10.8 thousand, hematocrit 42.8%, platelet count 168. CT scan of the abdomen and pelvis with oral contrast was personally reviewed by myself and a 6 mm left ureterovesical junction stone with mild to moderate hydronephrosis was appreciated. There did also appear to be some calcification within the prostate and/or at the bladder base.   ASSESSMENT:  1. Obstructing left ureterovesical junction stone. Progressive pain with difficulty with control with nausea and anorexia for 72 hours. The options for treating or managing the ureterovesical junction stone were reviewed at length with the patient and his family. Given the progressive pain over the last three days, the patient elects ureteral stent placement. The patient and his family understand the possibility that the stone is impacted at the ureterovesical junction and that retrograde access may not be obtainable.  2. Acute renal insufficiency. Likely due to dehydration and acute unilateral obstruction. The patient reports that he has not voided for at least 24 hours and only has 240 milliliters on his bladder scan.   RECOMMENDATIONS:  1. Left ureteral stent  placement in the morning.  2. Place a Foley catheter straight drainage to monitor the patient's oliguria and response to fluid hydration.  3. Normal saline IV bolus with appropriate maintenance IV fluid to maintain a urine output in excess of 30 mL/h.  4. Agree with IV ceftriaxone q.24 hours.  5. Follow-up with Dr. Maryan Puls for definitive stone treatment.   Thank you for the opportunity to participate in the care of this most pleasant gentleman.    ____________________________ Darcella Cheshire, MD jhk:ap D: 02/20/2012 20:34:29 ET T: 02/21/2012 10:25:47 ET JOB#: [id number]  cc: Darcella Cheshire, MD, <Dictator> Darcella Cheshire MD ELECTRONICALLY SIGNED 02/22/2012 14:17

## 2015-02-17 NOTE — H&P (Signed)
PATIENT NAME:  Juan Beck, Juan Beck MR#:  [id number] DATE OF BIRTH:  15-Apr-1926  DATE OF ADMISSION:  03/07/2012  ADDENDUM  Please add the following to the original history and physical. I had failed to dictate his lung examination.   LUNGS: Clear to auscultation.  ____________________________ Otelia Limes. Yves Dill, MD mrw:cms D: 03/03/2012 15:42:58 ET T: 03/03/2012 15:59:19 ET JOB#: [id number]  cc: Otelia Limes. Yves Dill, MD, <Dictator> Royston Cowper MD ELECTRONICALLY SIGNED 03/07/2012 7:36

## 2015-02-17 NOTE — H&P (Signed)
PATIENT NAME:  Juan Beck, Juan Beck MR#:  [id number] DATE OF BIRTH:  November 20, 1925  DATE OF ADMISSION:  02/20/2012  PRIMARY CARE PHYSICIAN:  Dr. Frazier Richards. ER PHYSICIAN: Dr. Benjaman Lobe.  CHIEF COMPLAINT: Abdominal pain.   HISTORY OF PRESENT ILLNESS: The patient is an 81 year old male with history of hypertension, diabetes, transient ischemic attacks and history of gout who came in because of severe abdominal pain. The patient says that abdominal pain is mainly generalized and more focused on the right side, started around this morning. The patient says the pain is. 7/10 in severity when it came and it is not radiating to the back or to any other side and had been having some nausea, unable to keep anything down. Denies any dysuria or hematuria. The patient has no diarrhea. The patient has been having fever since Wednesday and has nausea since Wednesday. According to the wife, he did not eat since Wednesday and not taken medication also since Wednesday. The patient has history of kidney stones and prostate problems, sees Dr. Yves Dill. The last time he saw him was six years ago. The patient's blood pressure was very elevated when he came in. It was 161/94 and he received labetalol 10 mg IV and also morphine 2 mg IV. Now he has no pain in the abdomen and his blood pressure also is improved at 156/87. I was asked to admit for mildly elevated troponins of 0.07. The patient had a CT of the abdomen which showed left hydroureter nephrosis with 6 cm left ureteral calculus.    ALLERGIES: No known allergies.   PAST MEDICAL HISTORY:    1. Hypertension. 2. Diabetes. 3. History of transient ischemic attacks. 4. Gout.  5. History of chronic renal insufficiency.   MEDICATIONS:  1. Metformin 1 gram p.o. twice a day. 2. Glipizide 5 mg p.o. b.i.d.  3. Labetalol 300 mg p.o. b.i.d.  4. Dilantin 100 mg 2 tablets at night.  5. Hydro-Diuril 25 mg daily.  6. Norvasc 5 mg daily. 7. Plavix 75 mg p.o. daily.  8. Aspirin  81 mg daily. 9. Lantus 30 units daily.   SOCIAL HISTORY: He lives with wife. No smoking. No drinking.   PAST SURGICAL HISTORY:  1. History of renal artery stent placement four years ago.  2. The patient had laser prostate surgery.  3. Rhinoplasty.   FAMILY HISTORY: Significant for hypertension and colon cancer for father.   REVIEW OF SYSTEMS: CONSTITUTIONAL: Has fever and fatigue. EYES: No blurred vision. ENT: No tinnitus. No epistaxis. No difficulty swallowing. RESPIRATORY: No cough. CARDIOVASCULAR: No chest pain. No palpitations. GASTROINTESTINAL: Has nausea, abdominal pain, but no constipation and no diarrhea. The patient's last bowel movement was two days ago. GENITOURINARY: Denies dysuria or hematuria. Has history of kidney stones before. The patient has a history of prostate problems and elevated prostate, sees Dr. Yves Dill. INTEGUMENT: No skin rashes. MUSCULOSKELETAL: Has a history of gout and had recent flare on Wednesday and took colchicine. NEUROLOGIC: History of transient ischemic attacks present before and he actually had an admission in 2008 for transient ischemic attacks and he has been on Dilantin because of possible transient ischemic attack versus postictal state and he is taking Dilantin since then. The patient has been on aspirin and Plavix also for that. Denies any neurological problem and able to walk without a cane. PSYCH: Has no anxiety or insomnia.   PHYSICAL EXAMINATION:  VITAL SIGNS: Temperature 94.3, pulse 103, respirations 18, blood pressure 161/94 and then 197/108 in the ER this morning  and repeat blood pressure at this time is 156/87, pulse 73.   GENERAL: The patient is communicating, oriented to time, place, person, not in distress. Denies abdominal pain and he feels hungry.   HEENT: Head atraumatic, normocephalic. Pupils are equally reacting to light. Extraocular movements are intact.   ENT: No tympanic membrane congestion. No turbinate hypertrophy. No oropharyngeal  erythema.   NECK: Normal range of motion. No JVD. No carotid bruit.   CARDIOVASCULAR: S1, S2 regular. No murmurs. PMI not displaced. Pulses are present in dorsalis pedis and femoral artery and they are equal.   ABDOMEN: The patient has no tenderness on my exam. Bowel sounds present. No organomegaly. No CVA tenderness.   NEUROLOGIC: No focal neurological deficit. Cranial nerves II through XII intact. Power 5/5 in upper and lower extremities. Sensation intact. Deep tendon reflexes 2+ bilaterally.   PSYCH: Mood and affect are within normal limits.   SKIN: Warm and dry.   LABORATORY, RADIOLOGICAL AND DIAGNOSTIC DATA: EKG shows normal sinus at 84 beats per minute and no ST-T changes. The patient has T wave inversions in Lead V1, V2, V3. CT of the abdomen: Lung bases are clear. Has coronary atherosclerosis. There is a 6 mm left UPJ calculus resulting in mild left hydroureter nephrosis, bilateral nephrolithiasis and left renal artery stent is present. No perinephric stranding and kidneys are symmetrical in size. Liver is normal. Gallbladder is unremarkable. Spleen is normal. Adrenals normal. Stomach, duodenum and small intestines are normal. The patient has normal appendix. The patient has no pneumoperitoneum, no pneumatosis. The patient has abdominal aorta which is normal in caliber with atherosclerosis. The patient's abdominal x-ray three-way done showed unremarkable series. Ultrasound of aorta is done because of elevated blood pressure to rule out dissection. Abdominal ultrasound showed no evidence of abnormal aortic aneurysm or dilation. Electrolytes: Sodium 135, potassium 4.5, chloride 98, bicarbonate 28, BUN 57, creatinine 1.96 glucose 336. Liver functions within normal limits. WBC 10.8, hemoglobin 14.9, hematocrit 42.8, platelets 168. Troponin was slightly elevated at 0.08. The second set of troponin is even down at 0.07, which is just resulted. Creatinine in 2008 according to the discharge dictation  was 1.7 and today it is 1.96.   ASSESSMENT AND PLAN:  1. An 81 year old male with abdominal pain and also left ureteral stone. Abdominal pain is likely related to ureteral stone and the patient is going to be admitted. He will be having IV fluids normal saline at 100 mL/h. Obtain urology consult.  spoke with Dr.Kim,urologist on call.The patient had a history of benign prostatic hypertrophy and bladder stones before, so we are going to get urology consult and continue IV hydration and pain medication.monitor urine output  2. Acute on chronic renal failure. The patient has probably chronic kidney disease stage III. His metformin will be on hold because of poor p.o. intake and dehydration with acute renal failure and his kidney function is going to be monitored closely.  3. Diabetes mellitus. The patient has been not eating well since last three days. His sugars are high, but he is not in diabetic ketoacidosis. The patient will be on sliding scale with coverage elevation on glipizide, but hold the metformin because of his renal failure. Obtain hemoglobin A1c.  4. Hypertension, uncontrolled. The patient has been off medications since Wednesday because of nausea, so resume labetalol and his blood pressure was high probably because of his pain this morning and he will be monitored for blood pressure fluctuations and we will continue labetalol.  5. History of  transient ischemic attacks, on Dilantin, aspirin and Plavix. Resume them and check fasting lipase.   The patient is going to be seen by [facility] physician tomorrow.   TIME SPENT: About 60 minutes.   ____________________________ Epifanio Lesches, MD sk:ap D: 02/20/2012 15:52:02 ET T: 02/21/2012 07:48:22 ET JOB#: [id number]  cc: Epifanio Lesches, MD, <Dictator> Ocie Cornfield. Ouida Sills, MD Epifanio Lesches MD ELECTRONICALLY SIGNED 02/22/2012 12:52

## 2015-02-17 NOTE — Op Note (Signed)
PATIENT NAME:  Juan Beck, Juan Beck MR#:  [id number] DATE OF BIRTH:  07-11-1926  DATE OF PROCEDURE:  02/21/2012  PREOPERATIVE DIAGNOSIS: Left distal ureteral stone.  POSTOPERATIVE DIAGNOSIS: Left distal ureteral stone, bladder stone, and prostate fossa calcification.  PROCEDURES: 1. Cystourethroscopy with temporary left ureteral catheterization.  2. Selective urine collection from the left renal pelvis.  3. Left double-J ureteral stent placement.  SURGEON: Darcella Cheshire, M.D.  ANESTHESIA: General.  ESTIMATED BLOOD LOSS: Minimal.  COMPLICATIONS: None.  DRAINS: 6 French x 28 cm left double-J ureteral stent.   PATHOLOGY SPECIMENS: Urine from the left renal pelvis for Gram stain with culture and sensitivities.  DESCRIPTION OF PROCEDURE: The patient was brought to the cystoscopy suite and after general anesthesia was established was placed in the dorsal lithotomy position and prepped and draped in the usual sterile fashion. The 21 French cystoscope sheath was then inserted with the 30 degree lens. The urethra was smooth without lesion or abnormality. Within the prostate, calcification of the right proximal lateral prostate fossa was appreciated extending in toward the bladder neck. Within the bladder, there were single ureteral orifices bilaterally, however, the left ureteral orifice was noted to be erythematous and edematous. Within the bladder, there was a moderate sized knobby yellow stone sitting at the bladder base. There were no bladder mucosal lesions or tumors. The angle-tipped sensor guidewire was then advanced through the cystoscope into the left ureteral orifice and resistance was encountered within the intramural tunnel within a centimeter of the ureteral orifice. As such, a 6 [place] open-ended ureteral catheter was advanced over the sensor wire and to just within the left ureteral orifice. Gentle manipulation of the angle-tipped sensor wire was successful in advancing the wire into the  proximal ureter. The open-ended ureteral catheter was then advanced over the sensor wire into the region of the proximal ureter, and the guidewire was unable to be advanced into the region of the left renal pelvis under fluoroscopic guidance. The open-ended ureteral catheter was then advanced into the region of the left renal pelvis under fluoroscopic guidance and the sensor wire then withdrawn. A hydro nephrotic drip was appreciated through the open-ended ureteral catheter. The urine was noted to be cola-colored without gross purulence. A sample was sent for Gram stain and culture and sensitivities. A 0.035 straight [place] guidewire was then inserted into the open-ended ureteral catheter and advanced into the region of the left renal pelvis under fluoroscopic guidance. The open-ended ureteral catheter was then withdrawn. A 6 French x 28 cm double-J ureteral stent was then advanced over the guidewire and once a good curl was obtained in the renal pelvis, the guidewire was removed producing good curl in the bladder. The string had been removed from the distal aspect of the ureteral stent prior to placement. The bladder was then left full and the cystoscope withdrawn. A 16 French Silastic Foley catheter was then placed to straight drainage with 10 mL of sterile water in the balloon. The patient was then taken out of the dorsal lithotomy position and extubated and transferred to the postanesthesia care unit in stable condition having tolerated the procedure well.  ____________________________ Darcella Cheshire, MD jhk:slb D: 02/21/2012 14:29:47 ET T: 02/21/2012 15:09:54 ET JOB#: [id number]  cc: Darcella Cheshire, MD, <Dictator> Darcella Cheshire MD ELECTRONICALLY SIGNED 02/22/2012 14:18

## 2015-02-17 NOTE — H&P (Signed)
PATIENT NAME:  Juan Beck, Juan Beck MR#:  [id number] DATE OF BIRTH:  27-Jan-1926  DATE OF ADMISSION:  03/07/2012  CHIEF COMPLAINT: Left ureteral stone.   HISTORY OF PRESENT ILLNESS: Juan Beck is an 81 year old white male who was hospitalized from April 27th through April 29th with left renal colic. He underwent stent placement at that time. He comes in now for stent retrieval with possible ureteroscopic ureterolithotomy with holmium laser lithotripsy. He has a history of a left ureteral stone.   ALLERGIES: No drug allergies.   PAST SURGICAL HISTORY:  1. Lithotripsy in 2000. 2. Ureteroscopic ureterolithotomy in 2001. 3. Photovaporization of the prostate with green light laser in 2007.   4. Renal artery stent placement in 2008. 5. Rhinoplasty in the distant past.   SOCIAL HISTORY: The patient denied tobacco or alcohol use.   FAMILY HISTORY: Remarkable for colon cancer.   PAST AND CURRENT MEDICAL CONDITIONS:  1. Hypertension. 2. Diabetes. 3. History of transient ischemic attack. 4. Gout.  5. Chronic renal insufficiency.    CURRENT MEDICATIONS:  1. Aspirin. 2. Plavix. 3. Amlodipine. 4. Dilantin. 5. Labetalol. 6. Glipizide. 7. Lisinopril. 8. HCTZ. 9. Metformin. 10. Lantus insulin.   REVIEW OF SYSTEMS: The patient denied chest pain or shortness of breath.   PHYSICAL EXAMINATION:   GENERAL: Elderly white male in no acute distress.   HEENT: Sclerae were clear. Pupils were equally round and reactive to light and accommodation. Extraocular movements were intact.   NECK: Supple. No palpable neck masses.   CARDIOVASCULAR: Regular rhythm and rate without audible murmurs.   ABDOMEN: Soft, nontender abdomen.   GU: The patient was uncircumcised. Testes were atrophic.   RECTAL: 45 gram smooth, nontender prostate.   NEUROMUSCULAR: Nonfocal.   IMPRESSION: Left ureterolithiasis status post stent placement.   PLAN:  1. Cysto with stent retrieval. 2. Possible left ureteroscopic  ureterolithotomy with holmium laser lithotripsy.   ____________________________ Otelia Limes. Yves Dill, MD mrw:drc D: 03/01/2012 13:16:00 ET T: 03/01/2012 13:41:24 ET JOB#: [id number]  cc: Otelia Limes. Yves Dill, MD, <Dictator> Royston Cowper MD ELECTRONICALLY SIGNED 03/01/2012 16:49

## 2015-02-17 NOTE — Discharge Summary (Signed)
PATIENT NAME:  Juan Beck, Juan Beck MR#:  [id number] DATE OF BIRTH:  [birth year]  DATE OF ADMISSION:  02/20/2012 DATE OF DISCHARGE:  02/22/2012  DISCHARGE DIAGNOSES:  1. Ureterolithiasis with obstruction.  2. Hydronephrosis.  3. Acute renal failure from above.  4. Urinary tract infection.  5. History of diabetes. 6. History of seizures.  7. History of accelerated hypertension, presently controlled.   DISCHARGE MEDICATIONS:  1. New medication of Cipro 500 mg p.o. b.i.d. until cultures from surgical procedures get back and this can be adjusted. 2. Aspirin 81 mg daily.  3. Plavix 75 mg daily.  4. Amlodipine 5 mg daily.  5. Dilantin 100 mg 2 p.o. at bedtime. 6. Labetalol 300 mg p.o. b.i.d.  7. Glipizide 5 mg p.o. b.i.d.  8. Lisinopril/hydrochlorothiazide 20/25, 1 p.o. daily.  9. Metformin 1000 mg p.o. b.i.d., which is safe to restart as his renal function is improved. 10. Lantus 30 units daily.   HISTORY AND PHYSICAL: Please see detailed history and physical done on admission.  HOSPITAL COURSE: Admitted with left lower abdominal pain and nephrolithiasis with hydronephrosis and creatinine of approximately 2 which is above his baseline which is relatively normal. White count was 10.8 as well. He was started on ceftriaxone. Urology was consulted. Retrograde stent was performed on the 28th with success. Joaquim Lai is now out, seems to be 8 to 9 mm per my visual inspection. Did have urinalysis done with 46 RBCs, 12 WBCs per high-power field. Unclear if urine culture was done in the Emergency Room on admission. Blood culture was negative. Culture of the urine was done intraoperatively per the op report. Today's date after hydronephrosis was resolved shows creatinine down to 1.34, glucose 179 on sliding scale and Lantus alone. Hemoglobin 10.8 which is minimally down but his white count is low normal at 4.3 at this point. He is feeling well, having no pain, wishes to eat, wishes to go home, which I think is  reasonable. Will follow him up soon.   TIME SPENT: Took approximately 34 minutes to do discharge tasks today.   ____________________________ Ocie Cornfield. Ouida Sills, MD mwa:cms D: 02/22/2012 07:51:00 ET T: 02/22/2012 13:23:35 ET JOB#: [id number]  cc: Ocie Cornfield. Ouida Sills, MD, <Dictator> Kirk Ruths MD ELECTRONICALLY SIGNED 02/22/2012 19:35

## 2015-02-17 NOTE — Consult Note (Signed)
Brief Urology Consultation Report: MD: Epifanio Lesches, M.D.MD: Darcella Cheshire, M.D. Urologist: Maryan Puls, M.D.  Obstructing Left UVJ Stone - refractory pain with nausea/anorexia x 72hrsARI - likely due to dehydration and acute unilateral obstruction - pt has not voided for at least 24hrs with only 2100mL on Bladder Scan  Left Ureteral Stent in the AM (NPO after MN as already ordered)Place foley to straight drain to monitor oliguria/response to fluid hydration (order placed)534mL NS IV Bolus (order placed) with MIVF - re-bolus as necessary to maintain UO >39mL/hrAgree with Ceftriaxone IV q24hrs you for the opportunity to participate in the care of this most pleasant gentleman.  Electronic Signatures: Darcella Cheshire (MD)  (Signed on 27-Apr-13 20:18)  Authored  Last Updated: 27-Apr-13 20:18 by Darcella Cheshire (MD)

## 2015-02-17 NOTE — Op Note (Signed)
PATIENT NAME:  Juan Beck, Juan Beck MR#:  [id number] DATE OF BIRTH:  02-07-1926  DATE OF PROCEDURE:  03/07/2012  PREOPERATIVE DIAGNOSIS: Left ureterolithiasis.   POSTOPERATIVE DIAGNOSIS: Left ureterolithiasis.   PROCEDURES PERFORMED:  1. Left ureteroscopy. 2. Cystoscopy with stent retrieval.  3. Litholapaxy of bladder stone with the holmium laser.  4. Fluoroscopy.   SURGEON: Maryan Puls, MD   ANESTHETIST: Dr. Kayleen Memos  ANESTHETIC METHOD: General.   INDICATIONS: See the dictated history and physical. After informed consent, the patient requested the above procedures.   OPERATIVE SUMMARY: After adequate general anesthesia had been obtained, the patient was placed into dorsal lithotomy position and the perineum was prepped and draped in the usual fashion. The 21 French cystoscope was then coupled with the camera and then visually advanced into the bladder. The bladder was heavily trabeculated with diverticula and cellules present. The right side of the prostate urethra was calcified. The double pigtail stent was identified and removed with an alligator forceps. Fluoroscopy was performed throughout the procedure. A definite stone was not identified in the ureter. At this point, the mini ureteroscope was coupled with the camera and then visually advanced into the left ureteral orifice. The scope was easily advanced into the renal pelvis. No stones were seen within the ureter. At this point, the ureteroscope was removed and the cystoscope passed back into the bladder. The stone was identified in the bladder. Using the 550 micron holmium fiber the stone was fragmented. Stone     fragments were evacuated from the bladder. The bladder was then fully drained and the scope was removed. The procedure was then turned terminated and the patient was transferred to the recovery room in stable condition. ____________________________ Otelia Limes. Yves Dill, MD mrw:slb D: 03/07/2012 12:40:52 ET T: 03/07/2012  16:51:39 ET JOB#: [id number]  cc: Otelia Limes. Yves Dill, MD, <Dictator> Royston Cowper MD ELECTRONICALLY SIGNED 03/08/2012 13:32

## 2015-11-27 DIAGNOSIS — C44519 Basal cell carcinoma of skin of other part of trunk: Secondary | ICD-10-CM | POA: Diagnosis not present

## 2015-11-27 DIAGNOSIS — C44212 Basal cell carcinoma of skin of right ear and external auricular canal: Secondary | ICD-10-CM | POA: Diagnosis not present

## 2015-11-27 DIAGNOSIS — Z08 Encounter for follow-up examination after completed treatment for malignant neoplasm: Secondary | ICD-10-CM | POA: Diagnosis not present

## 2015-11-27 DIAGNOSIS — C44119 Basal cell carcinoma of skin of left eyelid, including canthus: Secondary | ICD-10-CM | POA: Diagnosis not present

## 2015-11-27 DIAGNOSIS — Z85828 Personal history of other malignant neoplasm of skin: Secondary | ICD-10-CM | POA: Diagnosis not present

## 2015-11-27 DIAGNOSIS — D044 Carcinoma in situ of skin of scalp and neck: Secondary | ICD-10-CM | POA: Diagnosis not present

## 2015-11-27 DIAGNOSIS — Z872 Personal history of diseases of the skin and subcutaneous tissue: Secondary | ICD-10-CM | POA: Diagnosis not present

## 2015-11-27 DIAGNOSIS — C44622 Squamous cell carcinoma of skin of right upper limb, including shoulder: Secondary | ICD-10-CM | POA: Diagnosis not present

## 2015-11-27 DIAGNOSIS — D485 Neoplasm of uncertain behavior of skin: Secondary | ICD-10-CM | POA: Diagnosis not present

## 2015-11-27 DIAGNOSIS — L570 Actinic keratosis: Secondary | ICD-10-CM | POA: Diagnosis not present

## 2015-11-27 DIAGNOSIS — D0422 Carcinoma in situ of skin of left ear and external auricular canal: Secondary | ICD-10-CM | POA: Diagnosis not present

## 2015-11-27 DIAGNOSIS — X32XXXA Exposure to sunlight, initial encounter: Secondary | ICD-10-CM | POA: Diagnosis not present

## 2016-01-10 DIAGNOSIS — L905 Scar conditions and fibrosis of skin: Secondary | ICD-10-CM | POA: Diagnosis not present

## 2016-01-10 DIAGNOSIS — C44519 Basal cell carcinoma of skin of other part of trunk: Secondary | ICD-10-CM | POA: Diagnosis not present

## 2016-01-10 DIAGNOSIS — C44622 Squamous cell carcinoma of skin of right upper limb, including shoulder: Secondary | ICD-10-CM | POA: Diagnosis not present

## 2016-01-24 DIAGNOSIS — C44119 Basal cell carcinoma of skin of left eyelid, including canthus: Secondary | ICD-10-CM | POA: Diagnosis not present

## 2016-01-24 DIAGNOSIS — C44212 Basal cell carcinoma of skin of right ear and external auricular canal: Secondary | ICD-10-CM | POA: Diagnosis not present

## 2016-01-24 DIAGNOSIS — L905 Scar conditions and fibrosis of skin: Secondary | ICD-10-CM | POA: Diagnosis not present

## 2016-01-24 DIAGNOSIS — C44219 Basal cell carcinoma of skin of left ear and external auricular canal: Secondary | ICD-10-CM | POA: Diagnosis not present

## 2016-01-27 DIAGNOSIS — E1122 Type 2 diabetes mellitus with diabetic chronic kidney disease: Secondary | ICD-10-CM | POA: Diagnosis not present

## 2016-01-27 DIAGNOSIS — E785 Hyperlipidemia, unspecified: Secondary | ICD-10-CM | POA: Diagnosis not present

## 2016-01-27 DIAGNOSIS — Z794 Long term (current) use of insulin: Secondary | ICD-10-CM | POA: Diagnosis not present

## 2016-01-27 DIAGNOSIS — N183 Chronic kidney disease, stage 3 (moderate): Secondary | ICD-10-CM | POA: Diagnosis not present

## 2016-01-27 DIAGNOSIS — E1169 Type 2 diabetes mellitus with other specified complication: Secondary | ICD-10-CM | POA: Diagnosis not present

## 2016-02-03 DIAGNOSIS — E785 Hyperlipidemia, unspecified: Secondary | ICD-10-CM | POA: Diagnosis not present

## 2016-02-03 DIAGNOSIS — N183 Chronic kidney disease, stage 3 (moderate): Secondary | ICD-10-CM | POA: Diagnosis not present

## 2016-02-03 DIAGNOSIS — G40309 Generalized idiopathic epilepsy and epileptic syndromes, not intractable, without status epilepticus: Secondary | ICD-10-CM | POA: Diagnosis not present

## 2016-02-03 DIAGNOSIS — I10 Essential (primary) hypertension: Secondary | ICD-10-CM | POA: Diagnosis not present

## 2016-02-03 DIAGNOSIS — I701 Atherosclerosis of renal artery: Secondary | ICD-10-CM | POA: Diagnosis not present

## 2016-02-03 DIAGNOSIS — E1122 Type 2 diabetes mellitus with diabetic chronic kidney disease: Secondary | ICD-10-CM | POA: Diagnosis not present

## 2016-02-03 DIAGNOSIS — Z794 Long term (current) use of insulin: Secondary | ICD-10-CM | POA: Diagnosis not present

## 2016-02-03 DIAGNOSIS — E1169 Type 2 diabetes mellitus with other specified complication: Secondary | ICD-10-CM | POA: Diagnosis not present

## 2016-02-06 DIAGNOSIS — D044 Carcinoma in situ of skin of scalp and neck: Secondary | ICD-10-CM | POA: Diagnosis not present

## 2016-06-11 DIAGNOSIS — E1169 Type 2 diabetes mellitus with other specified complication: Secondary | ICD-10-CM | POA: Diagnosis not present

## 2016-06-11 DIAGNOSIS — Z794 Long term (current) use of insulin: Secondary | ICD-10-CM | POA: Diagnosis not present

## 2016-06-11 DIAGNOSIS — E785 Hyperlipidemia, unspecified: Secondary | ICD-10-CM | POA: Diagnosis not present

## 2016-06-11 DIAGNOSIS — N183 Chronic kidney disease, stage 3 (moderate): Secondary | ICD-10-CM | POA: Diagnosis not present

## 2016-06-11 DIAGNOSIS — E1122 Type 2 diabetes mellitus with diabetic chronic kidney disease: Secondary | ICD-10-CM | POA: Diagnosis not present

## 2016-06-18 DIAGNOSIS — Z794 Long term (current) use of insulin: Secondary | ICD-10-CM | POA: Diagnosis not present

## 2016-06-18 DIAGNOSIS — I701 Atherosclerosis of renal artery: Secondary | ICD-10-CM | POA: Diagnosis not present

## 2016-06-18 DIAGNOSIS — E785 Hyperlipidemia, unspecified: Secondary | ICD-10-CM | POA: Diagnosis not present

## 2016-06-18 DIAGNOSIS — Z0000 Encounter for general adult medical examination without abnormal findings: Secondary | ICD-10-CM | POA: Diagnosis not present

## 2016-06-18 DIAGNOSIS — I700 Atherosclerosis of aorta: Secondary | ICD-10-CM | POA: Diagnosis not present

## 2016-06-18 DIAGNOSIS — G40309 Generalized idiopathic epilepsy and epileptic syndromes, not intractable, without status epilepticus: Secondary | ICD-10-CM | POA: Diagnosis not present

## 2016-06-18 DIAGNOSIS — E1169 Type 2 diabetes mellitus with other specified complication: Secondary | ICD-10-CM | POA: Diagnosis not present

## 2016-06-18 DIAGNOSIS — E1122 Type 2 diabetes mellitus with diabetic chronic kidney disease: Secondary | ICD-10-CM | POA: Diagnosis not present

## 2016-06-18 DIAGNOSIS — N183 Chronic kidney disease, stage 3 (moderate): Secondary | ICD-10-CM | POA: Diagnosis not present

## 2016-10-14 DIAGNOSIS — N183 Chronic kidney disease, stage 3 (moderate): Secondary | ICD-10-CM | POA: Diagnosis not present

## 2016-10-14 DIAGNOSIS — E785 Hyperlipidemia, unspecified: Secondary | ICD-10-CM | POA: Diagnosis not present

## 2016-10-14 DIAGNOSIS — E1122 Type 2 diabetes mellitus with diabetic chronic kidney disease: Secondary | ICD-10-CM | POA: Diagnosis not present

## 2016-10-14 DIAGNOSIS — Z794 Long term (current) use of insulin: Secondary | ICD-10-CM | POA: Diagnosis not present

## 2016-10-14 DIAGNOSIS — E1169 Type 2 diabetes mellitus with other specified complication: Secondary | ICD-10-CM | POA: Diagnosis not present

## 2016-10-21 DIAGNOSIS — I700 Atherosclerosis of aorta: Secondary | ICD-10-CM | POA: Diagnosis not present

## 2016-10-21 DIAGNOSIS — Z794 Long term (current) use of insulin: Secondary | ICD-10-CM | POA: Diagnosis not present

## 2016-10-21 DIAGNOSIS — G40409 Other generalized epilepsy and epileptic syndromes, not intractable, without status epilepticus: Secondary | ICD-10-CM | POA: Diagnosis not present

## 2016-10-21 DIAGNOSIS — G40309 Generalized idiopathic epilepsy and epileptic syndromes, not intractable, without status epilepticus: Secondary | ICD-10-CM | POA: Diagnosis not present

## 2016-10-21 DIAGNOSIS — N183 Chronic kidney disease, stage 3 (moderate): Secondary | ICD-10-CM | POA: Diagnosis not present

## 2016-10-21 DIAGNOSIS — Z0000 Encounter for general adult medical examination without abnormal findings: Secondary | ICD-10-CM | POA: Diagnosis not present

## 2016-10-21 DIAGNOSIS — I10 Essential (primary) hypertension: Secondary | ICD-10-CM | POA: Diagnosis not present

## 2016-10-21 DIAGNOSIS — E1122 Type 2 diabetes mellitus with diabetic chronic kidney disease: Secondary | ICD-10-CM | POA: Diagnosis not present

## 2016-10-21 DIAGNOSIS — I701 Atherosclerosis of renal artery: Secondary | ICD-10-CM | POA: Diagnosis not present

## 2016-10-21 DIAGNOSIS — E785 Hyperlipidemia, unspecified: Secondary | ICD-10-CM | POA: Diagnosis not present

## 2016-10-21 DIAGNOSIS — E1169 Type 2 diabetes mellitus with other specified complication: Secondary | ICD-10-CM | POA: Diagnosis not present

## 2017-02-12 ENCOUNTER — Other Ambulatory Visit
Admission: RE | Admit: 2017-02-12 | Discharge: 2017-02-12 | Disposition: A | Discharge status: Home / Self Care | Payer: PPO | Source: Skilled Nursing Facility | Attending: Internal Medicine | Admitting: Internal Medicine

## 2017-02-12 DIAGNOSIS — I10 Essential (primary) hypertension: Secondary | ICD-10-CM | POA: Insufficient documentation

## 2017-02-12 DIAGNOSIS — E786 Lipoprotein deficiency: Secondary | ICD-10-CM | POA: Diagnosis not present

## 2017-02-12 DIAGNOSIS — E119 Type 2 diabetes mellitus without complications: Secondary | ICD-10-CM | POA: Diagnosis not present

## 2017-02-12 LAB — PHENYTOIN LEVEL, TOTAL: Phenytoin Lvl: 5.6 ug/mL — ABNORMAL LOW (ref 10.0–20.0)

## 2017-02-19 DIAGNOSIS — I701 Atherosclerosis of renal artery: Secondary | ICD-10-CM | POA: Diagnosis not present

## 2017-02-19 DIAGNOSIS — E1122 Type 2 diabetes mellitus with diabetic chronic kidney disease: Secondary | ICD-10-CM | POA: Diagnosis not present

## 2017-02-19 DIAGNOSIS — I700 Atherosclerosis of aorta: Secondary | ICD-10-CM | POA: Diagnosis not present

## 2017-02-19 DIAGNOSIS — I10 Essential (primary) hypertension: Secondary | ICD-10-CM | POA: Diagnosis not present

## 2017-02-19 DIAGNOSIS — E785 Hyperlipidemia, unspecified: Secondary | ICD-10-CM | POA: Diagnosis not present

## 2017-02-19 DIAGNOSIS — G40309 Generalized idiopathic epilepsy and epileptic syndromes, not intractable, without status epilepticus: Secondary | ICD-10-CM | POA: Diagnosis not present

## 2017-02-19 DIAGNOSIS — Z794 Long term (current) use of insulin: Secondary | ICD-10-CM | POA: Diagnosis not present

## 2017-02-19 DIAGNOSIS — N183 Chronic kidney disease, stage 3 (moderate): Secondary | ICD-10-CM | POA: Diagnosis not present

## 2017-02-19 DIAGNOSIS — E1169 Type 2 diabetes mellitus with other specified complication: Secondary | ICD-10-CM | POA: Diagnosis not present

## 2017-06-25 DIAGNOSIS — E119 Type 2 diabetes mellitus without complications: Secondary | ICD-10-CM | POA: Diagnosis not present

## 2017-06-25 DIAGNOSIS — E785 Hyperlipidemia, unspecified: Secondary | ICD-10-CM | POA: Diagnosis not present

## 2017-07-05 DIAGNOSIS — E1122 Type 2 diabetes mellitus with diabetic chronic kidney disease: Secondary | ICD-10-CM | POA: Diagnosis not present

## 2017-07-05 DIAGNOSIS — I10 Essential (primary) hypertension: Secondary | ICD-10-CM | POA: Diagnosis not present

## 2017-07-05 DIAGNOSIS — Z794 Long term (current) use of insulin: Secondary | ICD-10-CM | POA: Diagnosis not present

## 2017-07-05 DIAGNOSIS — G40309 Generalized idiopathic epilepsy and epileptic syndromes, not intractable, without status epilepticus: Secondary | ICD-10-CM | POA: Diagnosis not present

## 2017-07-05 DIAGNOSIS — I701 Atherosclerosis of renal artery: Secondary | ICD-10-CM | POA: Diagnosis not present

## 2017-07-05 DIAGNOSIS — Z0000 Encounter for general adult medical examination without abnormal findings: Secondary | ICD-10-CM | POA: Diagnosis not present

## 2017-07-05 DIAGNOSIS — E785 Hyperlipidemia, unspecified: Secondary | ICD-10-CM | POA: Diagnosis not present

## 2017-07-05 DIAGNOSIS — N183 Chronic kidney disease, stage 3 (moderate): Secondary | ICD-10-CM | POA: Diagnosis not present

## 2017-07-05 DIAGNOSIS — E1169 Type 2 diabetes mellitus with other specified complication: Secondary | ICD-10-CM | POA: Diagnosis not present

## 2017-08-10 DIAGNOSIS — I10 Essential (primary) hypertension: Secondary | ICD-10-CM | POA: Diagnosis not present

## 2017-08-10 DIAGNOSIS — Z794 Long term (current) use of insulin: Secondary | ICD-10-CM | POA: Diagnosis not present

## 2017-08-10 DIAGNOSIS — N183 Chronic kidney disease, stage 3 (moderate): Secondary | ICD-10-CM | POA: Diagnosis not present

## 2017-08-10 DIAGNOSIS — R1032 Left lower quadrant pain: Secondary | ICD-10-CM | POA: Diagnosis not present

## 2017-08-10 DIAGNOSIS — E1122 Type 2 diabetes mellitus with diabetic chronic kidney disease: Secondary | ICD-10-CM | POA: Diagnosis not present

## 2017-10-25 DIAGNOSIS — N183 Chronic kidney disease, stage 3 (moderate): Secondary | ICD-10-CM | POA: Diagnosis not present

## 2017-10-25 DIAGNOSIS — E1122 Type 2 diabetes mellitus with diabetic chronic kidney disease: Secondary | ICD-10-CM | POA: Diagnosis not present

## 2017-10-25 DIAGNOSIS — Z794 Long term (current) use of insulin: Secondary | ICD-10-CM | POA: Diagnosis not present

## 2017-10-25 DIAGNOSIS — E785 Hyperlipidemia, unspecified: Secondary | ICD-10-CM | POA: Diagnosis not present

## 2017-10-25 DIAGNOSIS — E1169 Type 2 diabetes mellitus with other specified complication: Secondary | ICD-10-CM | POA: Diagnosis not present

## 2017-10-25 DIAGNOSIS — I10 Essential (primary) hypertension: Secondary | ICD-10-CM | POA: Diagnosis not present

## 2017-11-02 DIAGNOSIS — E1169 Type 2 diabetes mellitus with other specified complication: Secondary | ICD-10-CM | POA: Diagnosis not present

## 2017-11-02 DIAGNOSIS — E785 Hyperlipidemia, unspecified: Secondary | ICD-10-CM | POA: Diagnosis not present

## 2017-11-02 DIAGNOSIS — I701 Atherosclerosis of renal artery: Secondary | ICD-10-CM | POA: Diagnosis not present

## 2017-11-02 DIAGNOSIS — N183 Chronic kidney disease, stage 3 (moderate): Secondary | ICD-10-CM | POA: Diagnosis not present

## 2017-11-02 DIAGNOSIS — I10 Essential (primary) hypertension: Secondary | ICD-10-CM | POA: Diagnosis not present

## 2017-11-02 DIAGNOSIS — E1122 Type 2 diabetes mellitus with diabetic chronic kidney disease: Secondary | ICD-10-CM | POA: Diagnosis not present

## 2017-11-02 DIAGNOSIS — Z794 Long term (current) use of insulin: Secondary | ICD-10-CM | POA: Diagnosis not present

## 2017-11-02 DIAGNOSIS — I700 Atherosclerosis of aorta: Secondary | ICD-10-CM | POA: Diagnosis not present

## 2017-11-02 DIAGNOSIS — G40309 Generalized idiopathic epilepsy and epileptic syndromes, not intractable, without status epilepticus: Secondary | ICD-10-CM | POA: Diagnosis not present

## 2017-12-30 DIAGNOSIS — Z9181 History of falling: Secondary | ICD-10-CM | POA: Diagnosis not present

## 2017-12-30 DIAGNOSIS — M545 Low back pain: Secondary | ICD-10-CM | POA: Diagnosis not present

## 2017-12-30 DIAGNOSIS — Y92009 Unspecified place in unspecified non-institutional (private) residence as the place of occurrence of the external cause: Secondary | ICD-10-CM | POA: Diagnosis not present

## 2017-12-30 DIAGNOSIS — W19XXXA Unspecified fall, initial encounter: Secondary | ICD-10-CM | POA: Diagnosis not present

## 2018-02-23 ENCOUNTER — Encounter (HOSPITAL_COMMUNITY): Payer: Self-pay | Admitting: Emergency Medicine

## 2018-02-23 ENCOUNTER — Emergency Department (HOSPITAL_COMMUNITY): Payer: PPO

## 2018-02-23 ENCOUNTER — Other Ambulatory Visit: Payer: Self-pay

## 2018-02-23 ENCOUNTER — Inpatient Hospital Stay (HOSPITAL_COMMUNITY)
Admission: EM | Admit: 2018-02-23 | Discharge: 2018-03-26 | DRG: 064 | Disposition: E | Payer: PPO | Attending: Internal Medicine | Admitting: Internal Medicine

## 2018-02-23 DIAGNOSIS — N400 Enlarged prostate without lower urinary tract symptoms: Secondary | ICD-10-CM | POA: Diagnosis not present

## 2018-02-23 DIAGNOSIS — G40909 Epilepsy, unspecified, not intractable, without status epilepticus: Secondary | ICD-10-CM | POA: Diagnosis not present

## 2018-02-23 DIAGNOSIS — E785 Hyperlipidemia, unspecified: Secondary | ICD-10-CM | POA: Diagnosis present

## 2018-02-23 DIAGNOSIS — R402112 Coma scale, eyes open, never, at arrival to emergency department: Secondary | ICD-10-CM | POA: Diagnosis present

## 2018-02-23 DIAGNOSIS — R402212 Coma scale, best verbal response, none, at arrival to emergency department: Secondary | ICD-10-CM | POA: Diagnosis present

## 2018-02-23 DIAGNOSIS — Z7982 Long term (current) use of aspirin: Secondary | ICD-10-CM | POA: Diagnosis not present

## 2018-02-23 DIAGNOSIS — R4182 Altered mental status, unspecified: Secondary | ICD-10-CM | POA: Diagnosis not present

## 2018-02-23 DIAGNOSIS — Z794 Long term (current) use of insulin: Secondary | ICD-10-CM

## 2018-02-23 DIAGNOSIS — G935 Compression of brain: Secondary | ICD-10-CM | POA: Diagnosis not present

## 2018-02-23 DIAGNOSIS — I6200 Nontraumatic subdural hemorrhage, unspecified: Secondary | ICD-10-CM | POA: Diagnosis not present

## 2018-02-23 DIAGNOSIS — I701 Atherosclerosis of renal artery: Secondary | ICD-10-CM | POA: Diagnosis present

## 2018-02-23 DIAGNOSIS — J90 Pleural effusion, not elsewhere classified: Secondary | ICD-10-CM | POA: Diagnosis not present

## 2018-02-23 DIAGNOSIS — Z515 Encounter for palliative care: Secondary | ICD-10-CM | POA: Diagnosis not present

## 2018-02-23 DIAGNOSIS — IMO0001 Reserved for inherently not codable concepts without codable children: Secondary | ICD-10-CM

## 2018-02-23 DIAGNOSIS — S065XAA Traumatic subdural hemorrhage with loss of consciousness status unknown, initial encounter: Secondary | ICD-10-CM

## 2018-02-23 DIAGNOSIS — Z7902 Long term (current) use of antithrombotics/antiplatelets: Secondary | ICD-10-CM | POA: Diagnosis not present

## 2018-02-23 DIAGNOSIS — G8191 Hemiplegia, unspecified affecting right dominant side: Secondary | ICD-10-CM | POA: Diagnosis present

## 2018-02-23 DIAGNOSIS — I2510 Atherosclerotic heart disease of native coronary artery without angina pectoris: Secondary | ICD-10-CM | POA: Diagnosis present

## 2018-02-23 DIAGNOSIS — E119 Type 2 diabetes mellitus without complications: Secondary | ICD-10-CM

## 2018-02-23 DIAGNOSIS — Z4682 Encounter for fitting and adjustment of non-vascular catheter: Secondary | ICD-10-CM | POA: Diagnosis not present

## 2018-02-23 DIAGNOSIS — R296 Repeated falls: Secondary | ICD-10-CM | POA: Diagnosis not present

## 2018-02-23 DIAGNOSIS — Z888 Allergy status to other drugs, medicaments and biological substances status: Secondary | ICD-10-CM | POA: Diagnosis not present

## 2018-02-23 DIAGNOSIS — S065X9A Traumatic subdural hemorrhage with loss of consciousness of unspecified duration, initial encounter: Secondary | ICD-10-CM | POA: Diagnosis not present

## 2018-02-23 DIAGNOSIS — R402322 Coma scale, best motor response, extension, at arrival to emergency department: Secondary | ICD-10-CM | POA: Diagnosis present

## 2018-02-23 DIAGNOSIS — R29810 Facial weakness: Secondary | ICD-10-CM | POA: Diagnosis not present

## 2018-02-23 DIAGNOSIS — E1122 Type 2 diabetes mellitus with diabetic chronic kidney disease: Secondary | ICD-10-CM | POA: Diagnosis present

## 2018-02-23 DIAGNOSIS — R29724 NIHSS score 24: Secondary | ICD-10-CM | POA: Diagnosis not present

## 2018-02-23 DIAGNOSIS — I6201 Nontraumatic acute subdural hemorrhage: Secondary | ICD-10-CM | POA: Diagnosis not present

## 2018-02-23 DIAGNOSIS — Z8673 Personal history of transient ischemic attack (TIA), and cerebral infarction without residual deficits: Secondary | ICD-10-CM

## 2018-02-23 DIAGNOSIS — Z66 Do not resuscitate: Secondary | ICD-10-CM | POA: Diagnosis present

## 2018-02-23 DIAGNOSIS — I129 Hypertensive chronic kidney disease with stage 1 through stage 4 chronic kidney disease, or unspecified chronic kidney disease: Secondary | ICD-10-CM | POA: Diagnosis present

## 2018-02-23 DIAGNOSIS — N179 Acute kidney failure, unspecified: Secondary | ICD-10-CM | POA: Diagnosis present

## 2018-02-23 DIAGNOSIS — N183 Chronic kidney disease, stage 3 unspecified: Secondary | ICD-10-CM | POA: Diagnosis present

## 2018-02-23 DIAGNOSIS — I10 Essential (primary) hypertension: Secondary | ICD-10-CM | POA: Diagnosis present

## 2018-02-23 HISTORY — DX: Type 2 diabetes mellitus without complications: E11.9

## 2018-02-23 HISTORY — DX: Atherosclerosis of renal artery: I70.1

## 2018-02-23 HISTORY — DX: Reserved for inherently not codable concepts without codable children: IMO0001

## 2018-02-23 HISTORY — DX: Chronic kidney disease, stage 3 (moderate): N18.3

## 2018-02-23 HISTORY — DX: Essential (primary) hypertension: I10

## 2018-02-23 HISTORY — DX: Epilepsy, unspecified, not intractable, without status epilepticus: G40.909

## 2018-02-23 HISTORY — DX: Long term (current) use of insulin: Z79.4

## 2018-02-23 HISTORY — DX: Chronic kidney disease, stage 3 unspecified: N18.30

## 2018-02-23 HISTORY — DX: Benign prostatic hyperplasia without lower urinary tract symptoms: N40.0

## 2018-02-23 HISTORY — DX: Hyperlipidemia, unspecified: E78.5

## 2018-02-23 LAB — CBC
HCT: 26.5 % — ABNORMAL LOW (ref 39.0–52.0)
HEMOGLOBIN: 8.7 g/dL — AB (ref 13.0–17.0)
MCH: 31.8 pg (ref 26.0–34.0)
MCHC: 32.8 g/dL (ref 30.0–36.0)
MCV: 96.7 fL (ref 78.0–100.0)
Platelets: 140 10*3/uL — ABNORMAL LOW (ref 150–400)
RBC: 2.74 MIL/uL — ABNORMAL LOW (ref 4.22–5.81)
RDW: 14.9 % (ref 11.5–15.5)
WBC: 4.1 10*3/uL (ref 4.0–10.5)

## 2018-02-23 LAB — COMPREHENSIVE METABOLIC PANEL
ALBUMIN: 3.2 g/dL — AB (ref 3.5–5.0)
ALT: 13 U/L — ABNORMAL LOW (ref 17–63)
ANION GAP: 8 (ref 5–15)
AST: 16 U/L (ref 15–41)
Alkaline Phosphatase: 125 U/L (ref 38–126)
BILIRUBIN TOTAL: 0.5 mg/dL (ref 0.3–1.2)
BUN: 35 mg/dL — ABNORMAL HIGH (ref 6–20)
CO2: 17 mmol/L — ABNORMAL LOW (ref 22–32)
Calcium: 8.4 mg/dL — ABNORMAL LOW (ref 8.9–10.3)
Chloride: 115 mmol/L — ABNORMAL HIGH (ref 101–111)
Creatinine, Ser: 2.17 mg/dL — ABNORMAL HIGH (ref 0.61–1.24)
GFR calc Af Amer: 29 mL/min — ABNORMAL LOW (ref >60)
GFR calc non Af Amer: 25 mL/min — ABNORMAL LOW (ref >60)
GLUCOSE: 186 mg/dL — AB (ref 65–99)
POTASSIUM: 5.5 mmol/L — AB (ref 3.5–5.1)
Sodium: 140 mmol/L (ref 135–145)
TOTAL PROTEIN: 5.8 g/dL — AB (ref 6.5–8.1)

## 2018-02-23 LAB — DIFFERENTIAL
Basophils Absolute: 0 10*3/uL (ref 0.0–0.1)
Basophils Relative: 0 %
EOS ABS: 0.1 10*3/uL (ref 0.0–0.7)
EOS PCT: 2 %
LYMPHS ABS: 0.3 10*3/uL — AB (ref 0.7–4.0)
Lymphocytes Relative: 8 %
Monocytes Absolute: 0.5 10*3/uL (ref 0.1–1.0)
Monocytes Relative: 12 %
NEUTROS PCT: 78 %
Neutro Abs: 3.2 10*3/uL (ref 1.7–7.7)

## 2018-02-23 LAB — CBG MONITORING, ED: Glucose-Capillary: 180 mg/dL — ABNORMAL HIGH (ref 65–99)

## 2018-02-23 LAB — I-STAT CHEM 8, ED
BUN: 32 mg/dL — ABNORMAL HIGH (ref 6–20)
CREATININE: 2.1 mg/dL — AB (ref 0.61–1.24)
Calcium, Ion: 1.12 mmol/L — ABNORMAL LOW (ref 1.15–1.40)
Chloride: 115 mmol/L — ABNORMAL HIGH (ref 101–111)
Glucose, Bld: 190 mg/dL — ABNORMAL HIGH (ref 65–99)
HEMATOCRIT: 23 % — AB (ref 39.0–52.0)
HEMOGLOBIN: 7.8 g/dL — AB (ref 13.0–17.0)
Potassium: 5.3 mmol/L — ABNORMAL HIGH (ref 3.5–5.1)
SODIUM: 141 mmol/L (ref 135–145)
TCO2: 16 mmol/L — AB (ref 22–32)

## 2018-02-23 LAB — I-STAT TROPONIN, ED: Troponin i, poc: 0.03 ng/mL (ref 0.00–0.08)

## 2018-02-23 LAB — PROTIME-INR
INR: 1.14
PROTHROMBIN TIME: 14.6 s (ref 11.4–15.2)

## 2018-02-23 LAB — ETHANOL

## 2018-02-23 LAB — APTT: aPTT: 33 seconds (ref 24–36)

## 2018-02-23 MED ORDER — ACETAMINOPHEN 325 MG PO TABS: 650.0000 mg | ORAL_TABLET | Freq: Four times a day (QID) | ORAL | Status: DC | PRN

## 2018-02-23 MED ORDER — SUCCINYLCHOLINE CHLORIDE 20 MG/ML IJ SOLN
INTRAMUSCULAR | Status: AC | PRN
  Administered 2018-02-23: 100 mg via INTRAVENOUS

## 2018-02-23 MED ORDER — GLYCOPYRROLATE 0.2 MG/ML IJ SOLN
0.2000 mg | INTRAMUSCULAR | Status: DC | PRN
  Administered 2018-02-23 – 2018-02-24 (×5): 0.2 mg via INTRAVENOUS
  Filled 2018-02-23 (×5): qty 1

## 2018-02-23 MED ORDER — LORAZEPAM 1 MG PO TABS: 1.0000 mg | ORAL_TABLET | ORAL | Status: DC | PRN

## 2018-02-23 MED ORDER — POLYVINYL ALCOHOL 1.4 % OP SOLN
1.0000 [drp] | Freq: Four times a day (QID) | OPHTHALMIC | Status: DC | PRN
  Filled 2018-02-23: qty 15

## 2018-02-23 MED ORDER — ONDANSETRON 4 MG PO TBDP: 4.0000 mg | ORAL_TABLET | Freq: Four times a day (QID) | ORAL | Status: DC | PRN

## 2018-02-23 MED ORDER — BIOTENE DRY MOUTH MT LIQD: 15.0000 mL | OROMUCOSAL | Status: DC | PRN

## 2018-02-23 MED ORDER — PROPOFOL 1000 MG/100ML IV EMUL
INTRAVENOUS | Status: AC
  Filled 2018-02-23: qty 100

## 2018-02-23 MED ORDER — NICARDIPINE HCL IN NACL 20-0.86 MG/200ML-% IV SOLN: 3.0000 mg/h | Freq: Once | INTRAVENOUS | Status: DC

## 2018-02-23 MED ORDER — ETOMIDATE 2 MG/ML IV SOLN
INTRAVENOUS | Status: AC | PRN
  Administered 2018-02-23: 30 mg via INTRAVENOUS

## 2018-02-23 MED ORDER — PROPOFOL 1000 MG/100ML IV EMUL
INTRAVENOUS | Status: AC
  Administered 2018-02-23: 10 ug/min via INTRAVENOUS
  Filled 2018-02-23: qty 100

## 2018-02-23 MED ORDER — MORPHINE 100MG IN NS 100ML (1MG/ML) PREMIX INFUSION
5.0000 mg/h | INTRAVENOUS | Status: DC
  Administered 2018-02-23: 5 mg/h via INTRAVENOUS
  Filled 2018-02-23: qty 100

## 2018-02-23 MED ORDER — PROPOFOL 10 MG/ML IV BOLUS
0.5000 mg/kg | Freq: Once | INTRAVENOUS | Status: DC
  Administered 2018-02-23: 10 ug/min via INTRAVENOUS

## 2018-02-23 MED ORDER — WHITE PETROLATUM EX OINT
TOPICAL_OINTMENT | CUTANEOUS | Status: AC
  Filled 2018-02-23: qty 28.35

## 2018-02-23 MED ORDER — LORAZEPAM 2 MG/ML PO CONC: 1.0000 mg | ORAL | Status: DC | PRN

## 2018-02-23 MED ORDER — HALOPERIDOL LACTATE 2 MG/ML PO CONC
0.5000 mg | ORAL | Status: DC | PRN
  Filled 2018-02-23: qty 0.3

## 2018-02-23 MED ORDER — GLYCOPYRROLATE 0.2 MG/ML IJ SOLN: 0.2000 mg | INTRAMUSCULAR | Status: DC | PRN

## 2018-02-23 MED ORDER — LORAZEPAM 2 MG/ML IJ SOLN
1.0000 mg | INTRAMUSCULAR | Status: DC | PRN
  Filled 2018-02-23: qty 1

## 2018-02-23 MED ORDER — GLYCOPYRROLATE 1 MG PO TABS
1.0000 mg | ORAL_TABLET | ORAL | Status: DC | PRN
  Filled 2018-02-23: qty 1

## 2018-02-23 MED ORDER — HALOPERIDOL LACTATE 5 MG/ML IJ SOLN: 0.5000 mg | INTRAMUSCULAR | Status: DC | PRN

## 2018-02-23 MED ORDER — ACETAMINOPHEN 650 MG RE SUPP: 650.0000 mg | Freq: Four times a day (QID) | RECTAL | Status: DC | PRN

## 2018-02-23 MED ORDER — HALOPERIDOL 1 MG PO TABS: 0.5000 mg | ORAL_TABLET | ORAL | Status: DC | PRN

## 2018-02-23 MED ORDER — ONDANSETRON HCL 4 MG/2ML IJ SOLN: 4.0000 mg | Freq: Four times a day (QID) | INTRAMUSCULAR | Status: DC | PRN

## 2018-02-23 NOTE — H&P (Signed)
History and Physical    [employer] ZOX:[id number] DOB: Apr 08, 1926 DOA: 03/17/2018  **Will admit patient based on the expectation that the patient will need hospitalization/ hospital care that crosses at least 2 midnights  PCP: Kirk Ruths, MD   Attending physician: Lorin Mercy  Patient coming from/Resides with: Private residence/wife  Chief Complaint: Fall with large left subdural hematoma  HPI: Juan Emel. is a 84 y.o. male with medical history significant for hypertension, seizure disorder on Dilantin, diabetes on insulin, renal artery atherosclerosis status post stent, stage III chronic kidney disease, dyslipidemia who presented to the ER after a fall at home.  Patient has been having recurrent falls for several months.  LSN 9:30 PM on 02/22/2018.  His wife heard him fall this morning and he was found unresponsive by his wife.  He was brought to the ER by EMS as an acute code stroke.  Initial evaluation by the paramedics revealed unequal pupils with right pupil 2 mm and left pupil 4 mm flaccid on the right side and unresponsive.  There is a 3 upon arrival to the ER and required emergent intubation prior to imaging being performed.  Imaging revealed a large 3.2 cm mixed density left convexity subdural hematoma with rightward subfalcine herniation and entrapment of the right lateral ventricle.  Was evaluated by neurosurgery/Stern documented that this was a devastating subdural hematoma with severe brain compression and shift affecting the patient's dominant hemisphere with an underlying very poor neurological exam and associated severe brainstem compression.  This was documented as an unrecoverable and surgically uncorrectable event.  Patient was also on Plavix prior to his fall and platelets 140,000 in the ER.  Because of this surgery would be impossible.  Family members at bedside and have opted to make patient a DNR.  He remains intubated and in the ER awaiting other family  members to arrive before EDP performs terminal extubation.  Patient is breathing over the ventilator.  ED Course: Vital Signs: BP (!) 151/63   Pulse (!) 58   Resp 20   Wt 81.6 kg (180 lb)   SpO2 100%  CT Head: As above Chest x-ray: ET tube 7.8 cm above the carina, NG tube in place, cardiac enlargement with pulmonary vascular congestion, perihilar edema and small right pleural effusion Lab data: Sodium 140, potassium 5.5, chloride 115, CO2 17, glucose 186, BUN 35, creatinine 2.17, anion gap of 8, white count 4100 with normal differential, hemoglobin 8.7, platelets 140,000, coags within normal limits.  EtOH <10. Medications and treatments: Diprivan infusion, etomidate 30 mg x 1, succinylcholine 100 mg IV x1  Review of Systems:  **Patient unresponsive and intubated secondary to catastrophic subdural hematoma so unable to obtain history from patient Minimal history obtained from patient's family without any significant abnormalities detected  Past Medical History:  Diagnosis Date  . BPH (benign prostatic hyperplasia)   . CKD (chronic kidney disease) stage 3, GFR 30-59 ml/min (HCC)   . Diabetes mellitus, insulin dependent (IDDM), controlled ([place])   . HLD (hyperlipidemia)   . HTN (hypertension)   . Renal artery atherosclerosis ([place])   . Seizure disorder [facility])     History reviewed. No pertinent surgical history.  Social History   Socioeconomic History  . Marital status: Married    Spouse name: Not on file  . Number of children: Not on file  . Years of education: Not on file  . Highest education level: Not on file  Occupational History  . Not on  file  Social Needs  . Financial resource strain: Not on file  . Food insecurity:    Worry: Not on file    Inability: Not on file  . Transportation needs:    Medical: Not on file    Non-medical: Not on file  Tobacco Use  . Smoking status: Not on file  Substance and Sexual Activity  . Alcohol use: Not on file  . Drug use: Not on file   . Sexual activity: Not on file  Lifestyle  . Physical activity:    Days per week: Not on file    Minutes per session: Not on file  . Stress: Not on file  Relationships  . Social connections:    Talks on phone: Not on file    Gets together: Not on file    Attends religious service: Not on file    Active member of club or organization: Not on file    Attends meetings of clubs or organizations: Not on file    Relationship status: Not on file  . Intimate partner violence:    Fear of current or ex partner: Not on file    Emotionally abused: Not on file    Physically abused: Not on file    Forced sexual activity: Not on file  Other Topics Concern  . Not on file  Social History Narrative  . Not on file     Allergies  Allergen Reactions  . Statins Other (See Comments)    unknown    Family history reviewed and not pertinent to current admission findings, diagnosis in the context of underlying poor prognosis and anticipated in patient/hospital death  Prior to Admission medications   Medication Sig Start Date End Date Taking? Authorizing Provider  amLODipine (NORVASC) 5 MG tablet Take 10 mg by mouth every morning.   Yes [provider]  aspirin EC 81 MG tablet Take 81 mg by mouth daily.   Yes [provider]  clopidogrel (PLAVIX) 75 MG tablet Take 75 mg by mouth every evening.   Yes [provider]  insulin glargine (LANTUS) 100 UNIT/ML injection Inject 20 Units into the skin every evening.   Yes [provider]  labetalol (NORMODYNE) 200 MG tablet Take 200 mg by mouth daily.   Yes [provider]  phenytoin (DILANTIN) 100 MG ER capsule Take 200 mg by mouth at bedtime.   Yes [provider]  torsemide (DEMADEX) 20 MG tablet Take 20 mg by mouth daily.   Yes [provider]    Physical Exam: Vitals:   03/01/2018 0700 03/06/2018 0715 02/26/2018 0730 03/24/2018 0745  BP: 135/64 (!) 151/64 (!) 153/64 (!) 151/63  Pulse: (!) 59  (!) 59 60 (!) 58  Resp: (!) 21 20 (!) 21 20  SpO2: 100% 98% 100% 100%  Weight:          Constitutional: Intubated and sedated, appears stated age Eyes: Pupils pinpoint and nonreactive, lids and conjunctivae normal ENMT: Mucous membranes are dry.  Orally intubated Neck: normal, supple, no masses, no thyromegaly Respiratory: Coarse to auscultation.  Dated and assisted by ventilator with a VT of 450, FiO2 100%, set rate of 15 and PRVC mode, PEEP 5, spontaneous respirations at 20 breaths/min Cardiovascular: Regular rhythm with bradycardic rate, no murmurs / rubs / gallops. No extremity edema. 2+ pedal pulses. No carotid bruits.  Hypertensive Abdomen: no tenderness, no masses palpated. No hepatosplenomegaly. Bowel sounds positive.  Musculoskeletal: no clubbing / cyanosis. No joint deformity upper and lower  extremities. Good ROM, no contractures. Normal muscle tone.  Skin: no rashes, lesions, ulcers. No induration Neurologic: CN 2-12-incomplete testing performed given patient's inability to participate.  Pupils pinpoint and nonreactive in the context of Diprivan infusion, no blink reflex, upper extremities appear flaccid but again testing limited by sedation, appears to have bilateral lower extremity flexion posturing with bilateral flexion of feet and knees with unilateral stimulation to either right or left foot.  Unable to test strength and sensation; spontaneous respiratory effort intact Psychiatric: Intubated and sedated so unable to test   Labs on Admission: I have personally reviewed following labs and imaging studies  CBC: Recent Labs  Lab 03/22/2018 0507 03/19/2018 0508  WBC  --  4.1  NEUTROABS  --  3.2  HGB 7.8* 8.7*  HCT 23.0* 26.5*  MCV  --  96.7  PLT  --  981*   Basic Metabolic Panel: Recent Labs  Lab 03/18/2018 0507 03/10/2018 0508  NA 141 140  K 5.3* 5.5*  CL 115* 115*  CO2  --  17*  GLUCOSE 190* 186*  BUN 32* 35*  CREATININE 2.10* 2.17*  CALCIUM  --  8.4*    GFR: CrCl cannot be calculated (Unknown ideal weight.). Liver Function Tests: Recent Labs  Lab 03/20/2018 0508  AST 16  ALT 13*  ALKPHOS 125  BILITOT 0.5  PROT 5.8*  ALBUMIN 3.2*   No results for input(s): LIPASE, AMYLASE in the last 168 hours. No results for input(s): AMMONIA in the last 168 hours. Coagulation Profile: Recent Labs  Lab 02/26/2018 0508  INR 1.14   Cardiac Enzymes: No results for input(s): CKTOTAL, CKMB, CKMBINDEX, TROPONINI in the last 168 hours. BNP (last 3 results) No results for input(s): PROBNP in the last 8760 hours. HbA1C: No results for input(s): HGBA1C in the last 72 hours. CBG: Recent Labs  Lab 03/14/2018 0514  GLUCAP 180*   Lipid Profile: No results for input(s): CHOL, HDL, LDLCALC, TRIG, CHOLHDL, LDLDIRECT in the last 72 hours. Thyroid Function Tests: No results for input(s): TSH, T4TOTAL, FREET4, T3FREE, THYROIDAB in the last 72 hours. Anemia Panel: No results for input(s): VITAMINB12, FOLATE, FERRITIN, TIBC, IRON, RETICCTPCT in the last 72 hours. Urine analysis:    Component Value Date/Time   COLORURINE Yellow 02/12/2014 1726   APPEARANCEUR Clear 02/12/2014 1726   LABSPEC 1.016 02/12/2014 1726   PHURINE 5.0 02/12/2014 1726   GLUCOSEU Negative 02/12/2014 1726   HGBUR Negative 02/12/2014 1726   BILIRUBINUR Negative 02/12/2014 1726   KETONESUR Negative 02/12/2014 1726   PROTEINUR 100 mg/dL 02/12/2014 1726   NITRITE Negative 02/12/2014 1726   LEUKOCYTESUR Negative 02/12/2014 1726   Sepsis Labs: @LABRCNTIP (procalcitonin:4,lacticidven:4) )No results found for this or any previous visit (from the past 240 hour(s)).   Radiological Exams on Admission: Dg Chest Portable 1 View  Result Date: 03/25/2018 CLINICAL DATA:  Post intubation EXAM: PORTABLE CHEST 1 VIEW COMPARISON:  08/04/2013 FINDINGS: Endotracheal tube placed with tip measuring 7.8 cm above the carina. Enteric tube tip is off the field of view but below the left hemidiaphragm.  Cardiac enlargement. Pulmonary vascular congestion with perihilar infiltration likely representing edema. Suggestion of small right pleural effusion. No pneumothorax. Degenerative changes in the spine and shoulders. IMPRESSION: Endotracheal tube tip measures about 7.8 cm above the carina, in the upper mid trachea. Enteric tube appears in satisfactory position. Cardiac enlargement with pulmonary vascular congestion, perihilar edema, and small right pleural effusion. Electronically Signed   By: Lucienne Capers M.D.   On: 02/28/2018 05:23  Ct Head Code Stroke Wo Contrast  Result Date: 03/24/2018 CLINICAL DATA:  Code stroke. Right-sided facial droop. Unequal pupils. EXAM: CT HEAD WITHOUT CONTRAST TECHNIQUE: Contiguous axial images were obtained from the base of the skull through the vertex without intravenous contrast. COMPARISON:  None. FINDINGS: Brain: There is a large left convexity mixed density subdural hematoma that measures up to 3.2 cm in thickness. There is rightward subfalcine herniation with midline shift measuring 1.9 cm at the foramina of [place]. There is entrapment of the right lateral ventricle with dilatation of the temporal and occipital horns. Basal cisterns remain patent. No cerebellar tonsillar herniation. Vascular: No hyperdense vessel or unexpected vascular calcification. Skull: Normal visualized skull base, calvarium and extracranial soft tissues. Sinuses/Orbits: Mild paranasal sinus mucosal disease. Normal orbits. IMPRESSION: Large, 3.2 cm mixed density left convexity subdural hematoma with rightward subfalcine herniation and entrapment of the right lateral ventricle. Critical Value/emergent results were called by telephone at the time of interpretation on 03/23/2018 at 5:35 am to Dr. Rory Percy , who verbally acknowledged these results. Electronically Signed   By: Ulyses Jarred M.D.   On: 02/25/2018 05:38    EKG: (Independently reviewed) sinus rhythm with ventricular rate 81 bpm, QTc 493 ms,  nonspecific ST-T wave changes in inferior lateral leads, early R wave rotation, no acute ischemic changes.  EKG unchanged from previous EKG 2015  Assessment/Plan Principal Problem:   Subdural hematoma, acute  -Patient has sustained a significant subdural hematoma with left shift and impending herniation in context of fall at home while on Plavix with current platelets 140,000 -Neurosurgery has documented current condition unrecoverable and an operable -Awaiting additional family members to arrive before performing terminal extubation -We will begin appropriate EOL care once patient is terminally extubated-please EOL will orders once initiated -We will need to discontinue propofol once transitions to comfort measures and utilize a morphine infusion  Active Problems:   Diabetes mellitus, insulin dependent (IDDM), controlled  -Was on Lantus at home -Will not follow CBGs in context of EOL care    HTN (hypertension) -Patient was taking Norvasc, Demadex and Normodyne at home    CKD (chronic kidney disease), stage III  -Presenting renal function stable and at baseline    Renal artery atherosclerosis  -History of renal stent on aspirin and Plavix    BPH (benign prostatic hyperplasia) -Monitor for urinary retention in the context of unresponsive state and requirement for sedative medications-focus will be on comfort    HLD (hyperlipidemia) -Not on statin prior to admission    Seizure disorder  -Was on Dilantin prior to admission -Given acute neurological injury may need to provide Ativan if develops increased ICP related seizures    **Additional lab, imaging and/or diagnostic evaluation at discretion of supervising physician  DVT prophylaxis: EOL care/comfort measures Code Status: DO NOT RESUSCITATE Family Communication: Wife and multiple family members at bedside Disposition Plan: Anticipate hospital death Consults called: Neurosurgery/Stern    Samella Parr ANP-BC [facility] Pager [phone]   If 7PM-7AM, please contact night-coverage [email or web address] Password [facility]  03/01/2018, 8:18 AM

## 2018-02-23 NOTE — Consult Note (Signed)
Reason for Consult:Subdural hematoma Referring Physician: Ward  Juan Hoyer Michai Dieppa. is an 84 y.o. male.  HPI: Juan Beck. is a 84 y.o. male past medical history hypertension, diabetes, coronary artery disease, who went to bed in his usual state of health at 9:30 PM on 02/22/2018 and was found to be unresponsive and his wife heard him fall off the bed this morning.  EMS was called and was brought in as a acute code stroke to the hospital. According to EMS, on their initial evaluation, he had unequal pupils with the right pupil 2 mm left pupil 4 mm and flaccid on the right side and completely unresponsive. He continued to maintain his airway and saturations throughout his right in the ambulance.  He was a GCS 3 on arrival and required emergent intubation prior to any imaging being performed.      No past medical history on file.   No family history on file.  Social History:  has no tobacco, alcohol, and drug history on file.  Allergies: Allergies not on file  Medications:I have reviewed patient's medications  Results for orders placed or performed during the hospital encounter of 03/13/2018 (from the past 48 hour(s))  I-stat troponin, ED     Status: None   Collection Time: 02/26/2018  5:06 AM  Result Value Ref Range   Troponin i, poc 0.03 0.00 - 0.08 ng/mL   Comment 3            Comment: Due to the release kinetics of cTnI, a negative result within the first hours of the onset of symptoms does not rule out myocardial infarction with certainty. If myocardial infarction is still suspected, repeat the test at appropriate intervals.   I-Stat Chem 8, ED     Status: Abnormal   Collection Time: 03/10/2018  5:07 AM  Result Value Ref Range   Sodium 141 135 - 145 mmol/L   Potassium 5.3 (H) 3.5 - 5.1 mmol/L   Chloride 115 (H) 101 - 111 mmol/L   BUN 32 (H) 6 - 20 mg/dL   Creatinine, Ser 2.10 (H) 0.61 - 1.24 mg/dL   Glucose, Bld 190 (H) 65 - 99 mg/dL   Calcium, Ion 1.12 (L) 1.15 -  1.40 mmol/L   TCO2 16 (L) 22 - 32 mmol/L   Hemoglobin 7.8 (L) 13.0 - 17.0 g/dL   HCT 23.0 (L) 39.0 - 52.0 %  Protime-INR     Status: None   Collection Time: 03/21/2018  5:08 AM  Result Value Ref Range   Prothrombin Time 14.6 11.4 - 15.2 seconds   INR 1.14     Comment: Performed at [facility], [place] [address] [place], [place] [address]  APTT     Status: None   Collection Time: 03/06/2018  5:08 AM  Result Value Ref Range   aPTT 33 24 - 36 seconds    Comment: Performed at [facility] [address], [place],  [address]  CBC     Status: Abnormal   Collection Time: 03/19/2018  5:08 AM  Result Value Ref Range   WBC 4.1 4.0 - 10.5 K/uL   RBC 2.74 (L) 4.22 - 5.81 MIL/uL   Hemoglobin 8.7 (L) 13.0 - 17.0 g/dL   HCT 26.5 (L) 39.0 - 52.0 %   MCV 96.7 78.0 - 100.0 fL   MCH 31.8 26.0 - 34.0 pg   MCHC 32.8 30.0 - 36.0 g/dL   RDW 14.9 11.5 - 15.5 %  Platelets 140 (L) 150 - 400 K/uL    Comment: Performed at [facility], [place] [address], [place], [place] [address]  Differential     Status: Abnormal   Collection Time: 03/25/2018  5:08 AM  Result Value Ref Range   Neutrophils Relative % 78 %   Neutro Abs 3.2 1.7 - 7.7 K/uL   Lymphocytes Relative 8 %   Lymphs Abs 0.3 (L) 0.7 - 4.0 K/uL   Monocytes Relative 12 %   Monocytes Absolute 0.5 0.1 - 1.0 K/uL   Eosinophils Relative 2 %   Eosinophils Absolute 0.1 0.0 - 0.7 K/uL   Basophils Relative 0 %   Basophils Absolute 0.0 0.0 - 0.1 K/uL    Comment: Performed at [facility] [address], [place], [place] [address]  CBG monitoring, ED     Status: Abnormal   Collection Time: 03/24/2018  5:14 AM  Result Value Ref Range   Glucose-Capillary 180 (H) 65 - 99 mg/dL    Dg Chest Portable 1 View  Result Date: 03/22/2018 CLINICAL DATA:  Post intubation EXAM: PORTABLE CHEST 1 VIEW COMPARISON:  08/04/2013 FINDINGS: Endotracheal tube placed with tip measuring 7.8 cm above the carina. Enteric tube tip is off the field of view but  below the left hemidiaphragm. Cardiac enlargement. Pulmonary vascular congestion with perihilar infiltration likely representing edema. Suggestion of small right pleural effusion. No pneumothorax. Degenerative changes in the spine and shoulders. IMPRESSION: Endotracheal tube tip measures about 7.8 cm above the carina, in the upper mid trachea. Enteric tube appears in satisfactory position. Cardiac enlargement with pulmonary vascular congestion, perihilar edema, and small right pleural effusion. Electronically Signed   By: Lucienne Capers M.D.   On: 03/02/2018 05:23   Ct Head Code Stroke Wo Contrast  Result Date: 03/25/2018 CLINICAL DATA:  Code stroke. Right-sided facial droop. Unequal pupils. EXAM: CT HEAD WITHOUT CONTRAST TECHNIQUE: Contiguous axial images were obtained from the base of the skull through the vertex without intravenous contrast. COMPARISON:  None. FINDINGS: Brain: There is a large left convexity mixed density subdural hematoma that measures up to 3.2 cm in thickness. There is rightward subfalcine herniation with midline shift measuring 1.9 cm at the foramina of [place]. There is entrapment of the right lateral ventricle with dilatation of the temporal and occipital horns. Basal cisterns remain patent. No cerebellar tonsillar herniation. Vascular: No hyperdense vessel or unexpected vascular calcification. Skull: Normal visualized skull base, calvarium and extracranial soft tissues. Sinuses/Orbits: Mild paranasal sinus mucosal disease. Normal orbits. IMPRESSION: Large, 3.2 cm mixed density left convexity subdural hematoma with rightward subfalcine herniation and entrapment of the right lateral ventricle. Critical Value/emergent results were called by telephone at the time of interpretation on 03/07/2018 at 5:35 am to Dr. Rory Percy , who verbally acknowledged these results. Electronically Signed   By: Ulyses Jarred M.D.   On: 03/18/2018 05:38    Review of Systems - As above  Blood pressure (!) 128/59,  pulse 62, resp. rate (!) 0, weight 81.6 kg (180 lb), SpO2 99 %. Physical Exam  Assessment/Plan: This is a devastating subdural hematoma with severe brain compression and shift.  It is affecting the patient's dominant hemisphere.  He has a very poor neurological exam.  He has severe brainstem compression.  This is unrecoverable and surgically uncorrectable.  He is also on plavix, which makes surgery impossible at this point.  If we were to perform surgery, and the patient were to survive, he is unlikely to ever recover independence,  cognition, speech, or the ability to care for himself or to recognize or interact with others.  The most humane thing to do at this point would be to institute comfort care measures and let the patient expire with comfort and dignity.  Peggyann Shoals, MD 03/08/2018, 5:44 AM

## 2018-02-23 NOTE — Progress Notes (Signed)
Advanced patients endotracheal tube 3 CM per Dr. Leonides Schanz due to chest xray for ET tube confirmation.

## 2018-02-23 NOTE — Progress Notes (Signed)
Patient terminally extubated per MD order per family request.  Patient tolerated well.

## 2018-02-23 NOTE — Progress Notes (Signed)
Pt transported to CT scan on vent, no events to report.

## 2018-02-23 NOTE — Consult Note (Addendum)
Neurology Consultation  Reason for Consult: code stroke Referring Physician: Dr Leonides Schanz (EDP)  CC: unresponsiveness, asymmetric pupils, Rt sided flaciddity  History is obtained from: EMS  HPI: Corderro Koloski. is a 84 y.o. male past medical history hypertension, diabetes, coronary artery disease, who went to bed in his usual state of health at 9:30 PM on 02/22/2018 and was found to be unresponsive and his wife heard him fall off the bed this morning.  EMS was called and was brought in as a acute code stroke to the hospital. According to EMS, on their initial evaluation, he had unequal pupils with the right pupil 2 mm left pupil 4 mm and flaccid on the right side and completely unresponsive. He continued to maintain his airway and saturations throughout his right in the ambulance.  He was a GCS 3 on arrival and required emergent intubation prior to any imaging being performed.    LKW: 2130 hrs - 02/22/18 tpa given?: no- SDH Premorbid modified Rankin scale (mRS): 1  ROS: Unable to obtain due to altered mental status.   No past medical history on file. HTN CAD DM  No family history on file.   Social History:   has no tobacco, alcohol, and drug history on file.  Medications  Current Facility-Administered Medications:  .  propofol (DIPRIVAN) 1000 MG/100ML infusion, , , ,  No current outpatient medications on file.   Exam: Current vital signs: Wt 81.6 kg (180 lb)  Vital signs in last 24 hours: Weight:  [81.6 kg (180 lb)] 81.6 kg (180 lb) (05/01 0509) General: Patient is obtunded, in some respiratory distress. HEENT: Normocephalic, atraumatic, dry oral mucous membranes with dentures in place CVS: S1-S2 heard, regular rate rhythm Respiratory: Increased work of breathing, equal air entry bilaterally Abdomen: Soft nondistended nontender Extremities: Warm well perfused with intact pulses Neurological exam  patient is obtunded, unresponsive to voice or noxious stimulus. Does  not follow any commands. Cranial nerves: Pupils on my examination are equal round and reactive to light, right lower facial weakness, no gaze preference. Motor exam: Flaccid right upper extremity, triple flexion to noxious stimulus in right lower extremity, spontaneously moving left upper and lower extremity and in response to noxious stim. Sensory exam: As above [place] and gait cannot be tested.  NIHSS- 1a Level of Conscious.: 2 1b LOC Questions: 2 1c LOC Commands:2  2 Best Gaze: 0 3 Visual: 0 4 Facial Palsy:2  5a Motor Arm - left: 1  5b Motor Arm - Right: 4  6a Motor Leg - Left: 1 6b Motor Leg - Right:3  7 Limb Ataxia: 0 8 Sensory: 2 9 Best Language:3  10 Dysarthria: 2 11 Extinct. and Inatten.:0  TOTAL: 24  Labs I have reviewed labs in epic and the results pertinent to this consultation are:   CBC    Component Value Date/Time   WBC 2.4 (L) 02/22/2014 0720   RBC 2.87 (L) 02/22/2014 0720   HGB 7.8 (L) 02/28/2018 0507   HGB 8.7 (L) 02/22/2014 0720   HCT 23.0 (L) 03/07/2018 0507   HCT 26.6 (L) 02/22/2014 0720   PLT 159 02/22/2014 0720   MCV 93 02/22/2014 0720   MCH 30.4 02/22/2014 0720   MCHC 32.7 02/22/2014 0720   RDW 17.2 (H) 02/22/2014 0720   LYMPHSABS 0.2 (L) 02/22/2014 0720   MONOABS 0.3 02/22/2014 0720   EOSABS 0.1 02/22/2014 0720   BASOSABS 0.0 02/22/2014 0720    CMP     Component Value Date/Time  NA 141 02/22/2014 0720   K 4.2 02/22/2014 0720   CL 108 (H) 02/22/2014 0720   CO2 27 02/22/2014 0720   GLUCOSE 82 02/22/2014 0720   BUN 31 (H) 02/22/2014 0720   CREATININE 1.52 (H) 02/22/2014 0720   CALCIUM 8.7 02/22/2014 0720   PROT 6.9 02/12/2014 0808   ALBUMIN 3.5 02/12/2014 0808   AST 19 02/12/2014 0808   ALT 26 02/12/2014 0808   ALKPHOS 107 02/12/2014 0808   BILITOT 0.7 02/12/2014 0808   GFRNONAA 41 (L) 02/22/2014 0720   GFRAA 48 (L) 02/22/2014 0720    Lipid Panel     Component Value Date/Time   CHOL 172 02/21/2012 0745   TRIG 238 (H)  02/21/2012 0745   HDL 32 (L) 02/21/2012 0745   VLDL 48 (H) 02/21/2012 0745   LDLCALC 92 02/21/2012 0745    Imaging I have reviewed the images obtained:  CT-scan of the brain-large left hemisphere convexity SDH with 2cm MLS and brainstem herniation  Assessment:  89/M noted to have a fall from bed while asleep, following which he was unresponsive. Brought in as acute code stroke. NIHSS 24, GCS 3. CTH shows large SDH with MLS and herniation.  Recommendations: Acute SDH -Management per NSGY -SBP<140 goal -No antiplatelets/anticoagulants Please call neurology as needed.  -- Amie Portland, MD [facility] Pager: [phone] If 7pm to 7am, please call on call as listed on AMION.   CRITICAL CARE ATTESTATION This patient is critically ill and at significant risk of neurological worsening, death and care requires constant monitoring of vital signs, hemodynamics,respiratory and cardiac monitoring. I spent 50  minutes of neurocritical care time performing neurological assessment, discussion with family, other specialists and medical decision making of high complexityin the care of  this patient.

## 2018-02-23 NOTE — ED Provider Notes (Signed)
TIME SEEN: 5:21 AM  CHIEF COMPLAINT: Code stroke  HPI: Patient is an 84 year old male with history of hypertension, hyperlipidemia, diabetes, TIAs, renal stent on Plavix who presents to the emergency department as a code stroke.  Most of the history is obtained by [facility].  They report patient was last seen normal around 9:30 PM last night by his wife.  They state that the wife heard him fall out of bed and found him unresponsive.  He has had minimal movement of his left upper and lower extremity but complete flaccid paralysis of the right side.  Blood glucose with EMS was 149.  He has been hypertensive with blood pressures of 200/100s.  At this time patient is a full code.  PCP -Dr. Frazier Richards  ROS: Level 5 caveat for altered mental status  PAST MEDICAL HISTORY/PAST SURGICAL HISTORY:  No past medical history on file.  MEDICATIONS:  Prior to Admission medications   Not on File    ALLERGIES:  Allergies not on file  SOCIAL HISTORY:  Social History   Tobacco Use  . Smoking status: Not on file  Substance Use Topics  . Alcohol use: Not on file    FAMILY HISTORY: No family history on file.  EXAM: BP (!) 151/63   Pulse (!) 58   Resp 20   Wt 81.6 kg (180 lb)   SpO2 100%  CONSTITUTIONAL: Patient unresponsive, GCS 5 HEAD: Normocephalic, atraumatic EYES: Posterior approximately 4 mm bilaterally and sluggishly reactive ENT: normal nose; moist mucous membranes NECK: Supple, no meningismus, no nuchal rigidity, no LAD  CARD: RRR; S1 and S2 appreciated; no murmurs, no clicks, no rubs, no gallops RESP: Normal chest excursion without splinting or tachypnea; breath sounds clear and equal bilaterally; no wheezes, no rhonchi, no rales, no hypoxia or respiratory distress, speaking full sentences ABD/GI: Normal bowel sounds; non-distended; soft,  no hepatosplenomegaly BACK:  The back appears normal EXT: no edema; normal capillary refill; no cyanosis SKIN: Normal color for age  and race; warm; no rash NEURO: Patient does have some flexion of the left upper and lower extremity to painful stimuli, does not open his eyes, does not answer questions, does not follow commands, GCS 5, pupils equal bilaterally but sluggishly reactive   MEDICAL DECISION MAKING: Patient here as a code stroke.  Given GCS of 5 decision was made to intubate patient prior to transport to CT.  Dr. Rory Percy with neurology at bedside upon patient arrival.  Appreciate his help.  Patient intubated with 7.5 endotracheal tube using etomidate and succinylcholine.  Propofol used for sedation.  ED PROGRESS: Head CT shows what appears to be an acute on chronic significant left-sided subdural with significant right midline shift and signs of herniation.  Patient's daughter and wife at bedside have been updated with patient's prognosis.  Will discuss with neurosurgery on-call.  We will start IV Cardene given he is hypertensive with subdural hemorrhage.  5:45 AM  D/w Dr. Vertell Limber with neurosurgery.  He has reviewed patient's imaging.  Appreciate his help.  States at this time patient is not a surgical candidate and recommends that patient be made comfort care.  Discussed this with patient's family and they are very reasonable and agree.  They understand that if patient were to survive surgery he would likely have no quality of life and likely end up in a vegetative state.  They agree with keeping him a DNR/DNI I am keeping him comfortable.  They would like for [place] to keep him intubated at  this time until more family can arrive to see him.  Chaplain has been consulted to see family.  We will continue to keep patient sedated on propofol.  Will hold on Cardene.  7:55 AM  D/w Erin Hearing, NP with hospitalist service.  She will see patient for admission once he has been terminally extubated.  Discussed with patient's family.  They are still waiting for 2 family members to arrive from [facility] and they state should be here  in the next 30 minutes.  9:40 AM  Pt's family is at bedside.  They have agreed to keep patient DNR/DNI and we have terminally extubated him to a nasal cannula.  He is still breathing on his own and having some movements of his left side.  Morphine drip has been started.  Propofol has been stopped.  Dr. Lorin Mercy with hospitalist service has seen the patient and will place admission orders.  Appreciate hospitalist service help.  Chaplain has also been with patient's family.  Patient to be admitted for comfort care measures.  I reviewed all nursing notes, vitals, pertinent previous records, EKGs, lab and urine results, imaging (as available).    EKG Interpretation  Date/Time:  Wednesday Feb 23 2018 05:10:10 EDT Ventricular Rate:  81 PR Interval:    QRS Duration: 111 QT Interval:  424 QTC Calculation: 493 R Axis:   50 Text Interpretation:  Normal sinus rhythm Low voltage, extremity leads Minimal ST depression, anterolateral leads Borderline prolonged QT interval No significant change since last tracing Confirmed by Pryor Curia [phone]) on 03/14/2018 5:21:46 AM       Procedure Name: Intubation Performed by: Latamara Melder, Delice Bison, DO Pre-anesthesia Checklist: Patient identified, Emergency Drugs available, Suction available and Patient being monitored Oxygen Delivery Method: Ambu bag Induction Type: Rapid sequence Laryngoscope Size: Glidescope and 3 Grade View: Grade II Tube size: 7.5 mm Number of attempts: 1 Placement Confirmation: ETT inserted through vocal cords under direct vision,  Positive ETCO2 and Breath sounds checked- equal and bilateral Secured at: 22 (Endotracheal tube was advanced 3 cm after chest x-ray obtained) cm Tube secured with: ETT holder         CRITICAL CARE Performed by: Pryor Curia   Total critical care time: 60 minutes  Critical care time was exclusive of separately billable procedures and treating other patients.  Critical care was necessary to treat or  prevent imminent or life-threatening deterioration.  Critical care was time spent personally by me on the following activities: development of treatment plan with patient and/or surrogate as well as nursing, discussions with consultants, evaluation of patient's response to treatment, examination of patient, obtaining history from patient or surrogate, ordering and performing treatments and interventions, ordering and review of laboratory studies, ordering and review of radiographic studies, pulse oximetry and re-evaluation of patient's condition.    Andrue Dini, Delice Bison, DO 03/23/2018 [phone]

## 2018-02-23 NOTE — ED Triage Notes (Signed)
Pt in from home as Code stroke via [facility] with LSN 2130, wife states pt fell OOB at 0430 this morning. Has R side deficits, is unresponsive and pupils unequal. Takes Plavix, BP 200/100, GCS of 4.

## 2018-02-23 NOTE — ED Notes (Signed)
Checked CBG 180, RN Anna informed

## 2018-03-26 NOTE — Death Summary Note (Signed)
Death Summary  Juan Beck. VOZ:[id number] DOB: 03/28/1926 DOA: 03-23-18  PCP: Kirk Ruths, MD  Admit date: Mar 23, 2018 Date of Death: Mar 23, 2018  Final Diagnoses:  Principal Problem:   Subdural hematoma, acute ([place]) Active Problems:   Diabetes mellitus, insulin dependent (IDDM), controlled (HCC)   HTN (hypertension)   CKD (chronic kidney disease), stage III (HCC)   Renal artery atherosclerosis (HCC)   BPH (benign prostatic hyperplasia)   HLD (hyperlipidemia)   Seizure disorder (HCC)    History of present illness:  Juan Beck. is a 84 y.o. male with medical history significant for hypertension, seizure disorder on Dilantin, diabetes on insulin, renal artery atherosclerosis status post stent, stage III chronic kidney disease, dyslipidemia who presented to the ER after a fall at home.  Patient has been having recurrent falls for several months.  LSN 9:30 PM on 02/22/2018.  His wife heard him fall this morning and he was found unresponsive by his wife.  He was brought to the ER by EMS as an acute code stroke.  Initial evaluation by the paramedics revealed unequal pupils with right pupil 2 mm and left pupil 4 mm flaccid on the right side and unresponsive.  There is a 3 upon arrival to the ER and required emergent intubation prior to imaging being performed.  Imaging revealed a large 3.2 cm mixed density left convexity subdural hematoma with rightward subfalcine herniation and entrapment of the right lateral ventricle.  Was evaluated by neurosurgery/[place] documented that this was a devastating subdural hematoma with severe brain compression and shift affecting the patient's dominant hemisphere with an underlying very poor neurological exam and associated severe brainstem compression.  This was documented as an unrecoverable and surgically uncorrectable event.  Patient was also on Plavix prior to his fall and platelets 140,000 in the ER.  Because of this surgery would be  impossible.  Family members at bedside and have opted to make patient a DNR.  He remains intubated and in the ER awaiting other family members to arrive before EDP performs terminal extubation.  Patient is breathing over the ventilator.  Hospital Course:  Patient presented to the ER as a code stroke.  Because of a low GCS score was intubated upon arrival to the ER.  Imaging later revealed a significant left subdural hematoma.  Neurosurgery documented this as an unrecoverable event and would not be responsive or appropriate for surgery.  Patient was terminally extubated in the ER. Diprivan was discontinued in favor of morphine infusion.  Supportive care was provided with chaplaincy service.  On the morning of 03-24-2018 it was documented that the patient died at 34:15 AM with family at bedside.   Time: 9:15 AM  Signed:  ELLIS,ALLISON L. ANP   [facility] 03/23/18, 8:38 AM

## 2018-03-26 NOTE — Consult Note (Signed)
            [facility] CM Primary Care Navigator  03/10/2018  Juan Beck. Feb 20, 1926 [id number]   [name] to see patient at the bedsideto identify possible discharge needs but staff reportsthat patient had expiredearly this morning.    Per chart review, patient presented with a terminal head bleed (acute subdural hematoma) and was transitioned to comfort care.  Primary care provider's office (April for Dr. Frazier Richards) called and notified of patient's death.   For additional questions please contact:  Edwena Felty A. Montarius Kitagawa, BSN, RN-BC [facility] PRIMARY CARE Navigator Cell: [phone]

## 2018-03-26 NOTE — Progress Notes (Signed)
   03/05/18 0900  Clinical Encounter Type  Visited With Family  Visit Type Death  Referral From Nurse  Consult/Referral To Chaplain  Spiritual Encounters  Spiritual Needs Emotional;Prayer;Grief support  Stress Factors  Family Stress Factors Loss  Chaplain visited with PT family as their loved one has passed.  Chaplain provided support to the family in the form of prayer and presence.  Chaplain gave family information about patient placement.

## 2018-03-26 NOTE — Progress Notes (Signed)
Nutrition Brief Note  Chart reviewed. Pt now transitioning to comfort care.  No further nutrition interventions warranted at this time.  Please re-consult as needed.   Eural Holzschuh A. Sasan Wilkie, RD, LDN, CDE Pager: [phone] After hours Pager: [phone]  

## 2018-03-26 DEATH — deceased

## 2018-09-12 IMAGING — CT CT HEAD CODE STROKE
3 series · 16 of 47 positions shown, 19 images · non-contrast
Comparison: None.

CLINICAL DATA: Code stroke. Right-sided facial droop. Unequal
pupils.

EXAM:
CT HEAD WITHOUT CONTRAST
TECHNIQUE: Contiguous axial images were obtained from the base of the skull
through the vertex without intravenous contrast.

[Series 3: head 5.0 st · axial · 0.44mm/px · z∈[-85,+60]mm · 10 of 35 slices shown, 13 images]
[im 3/35  brain]
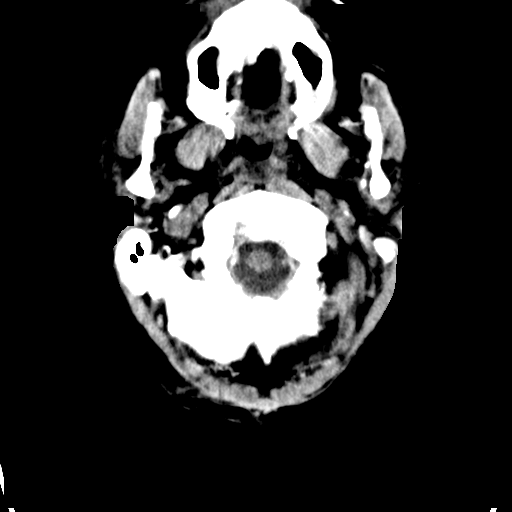
[im 3/35  bone]
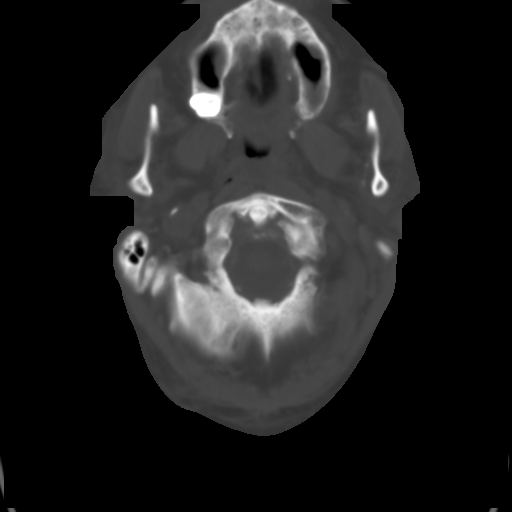
[im 6/35  brain]
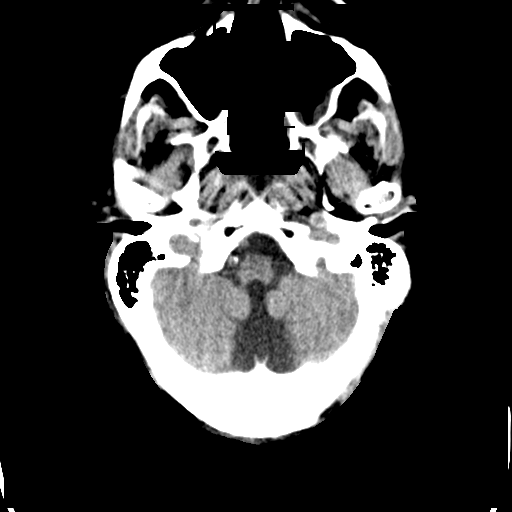
[im 10/35  brain]
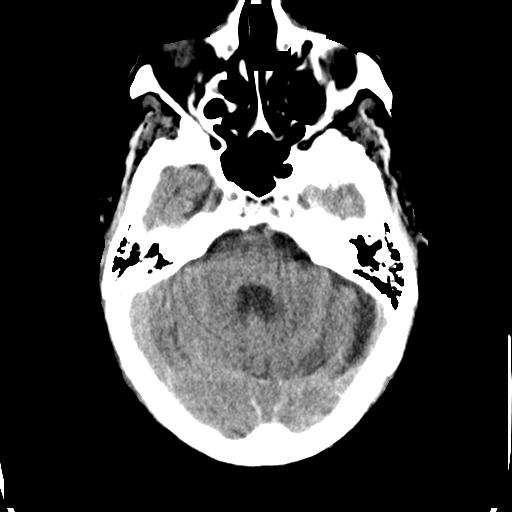
[im 12/35  brain]
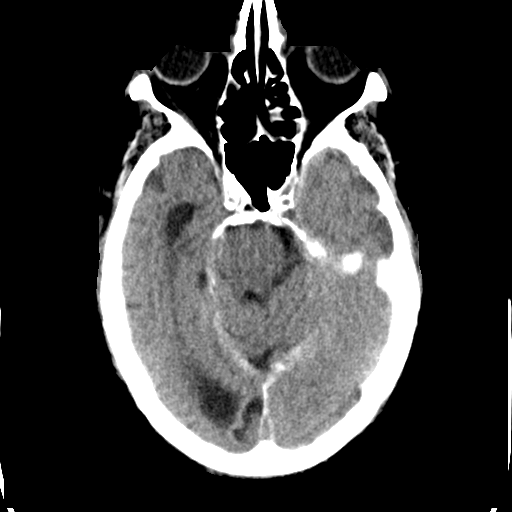
[im 16/35  brain]
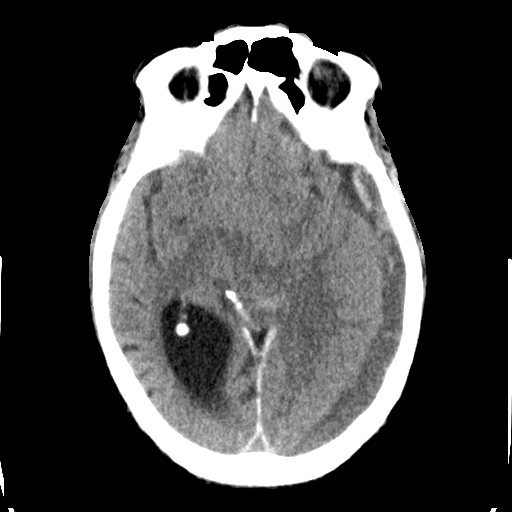
[im 16/35  bone]
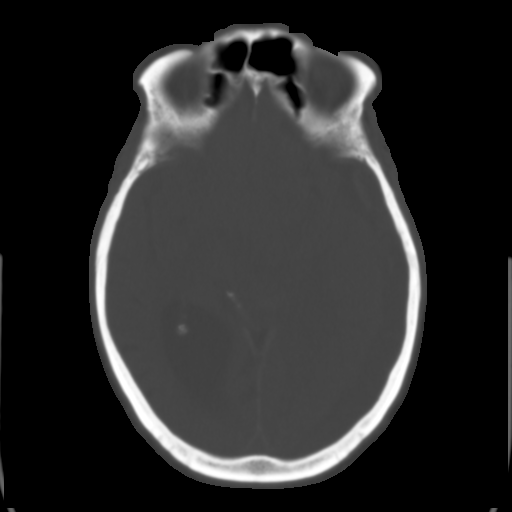
[im 19/35  brain]
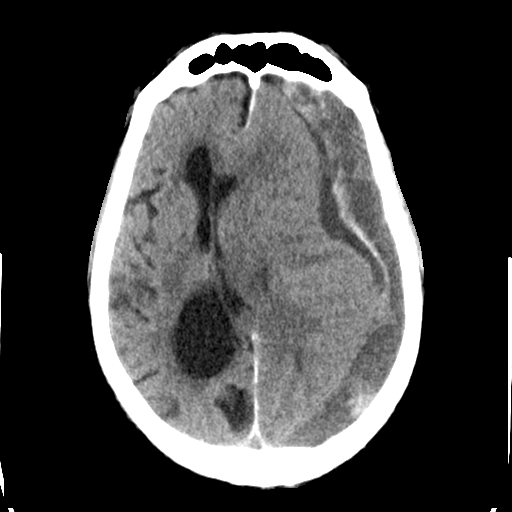
[im 23/35  brain]
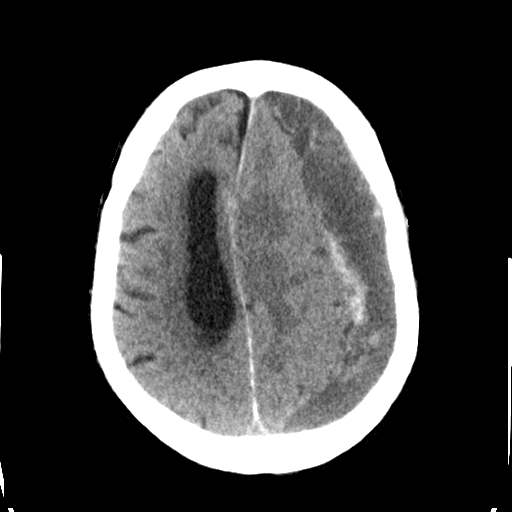
[im 26/35  brain]
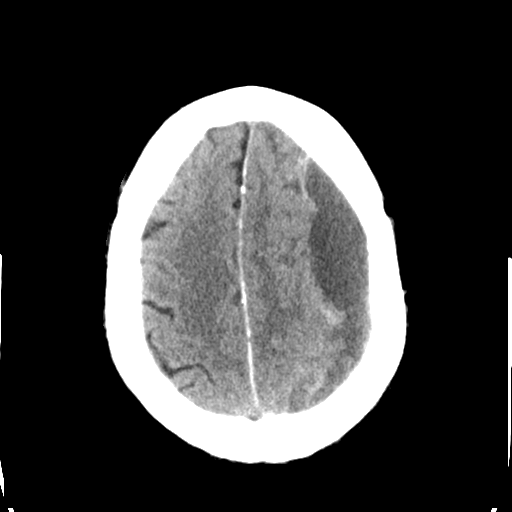
[im 29/35  brain]
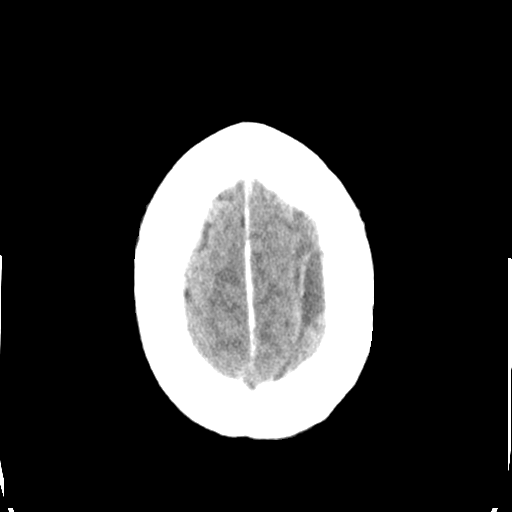
[im 29/35  bone]
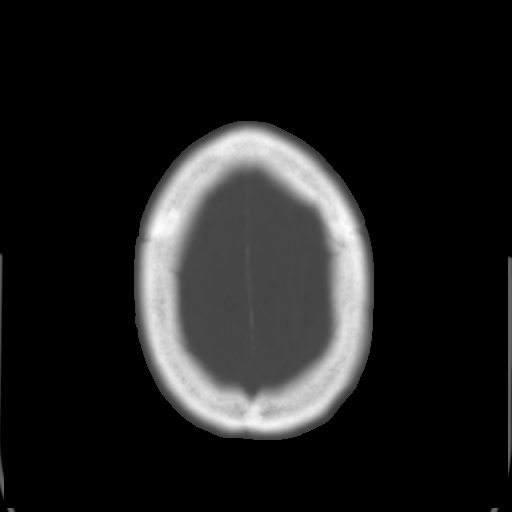
[im 32/35  brain]
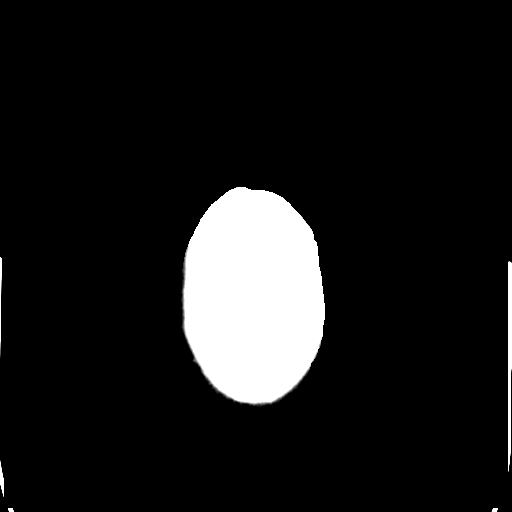

[Series 5: head 3.0 cor st · coronal · 0.32mm/px · 3 of 69 slices shown]
[im 23/69  brain]
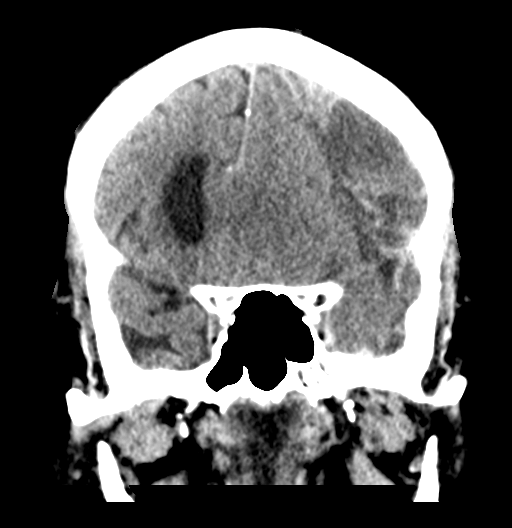
[im 31/69  brain]
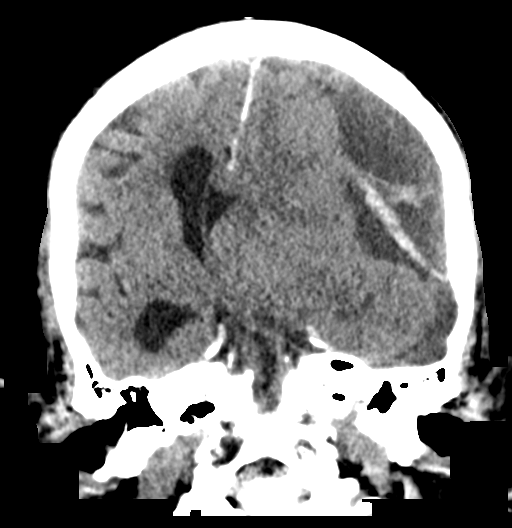
[im 38/69  brain]
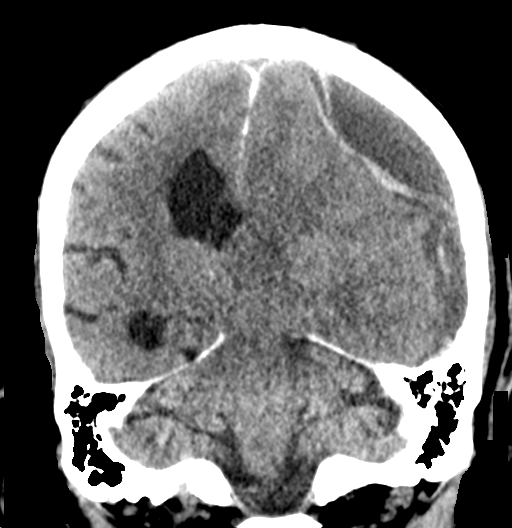

[Series 6: head 3.0 sag st · sagittal · 0.34mm/px · 3 of 55 slices shown]
[im 19/55  brain]
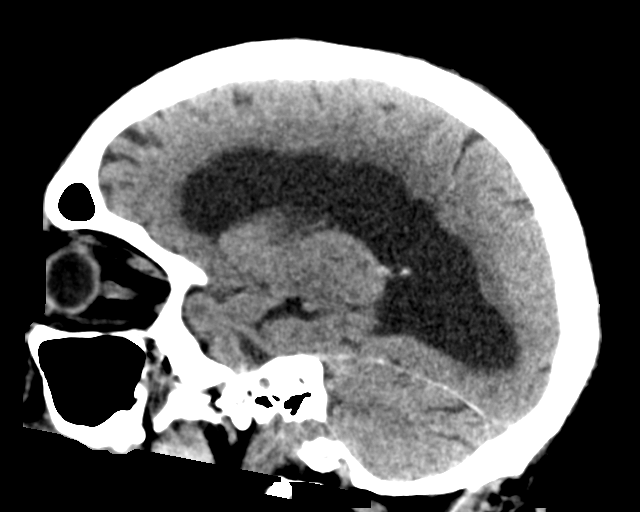
[im 28/55  brain]
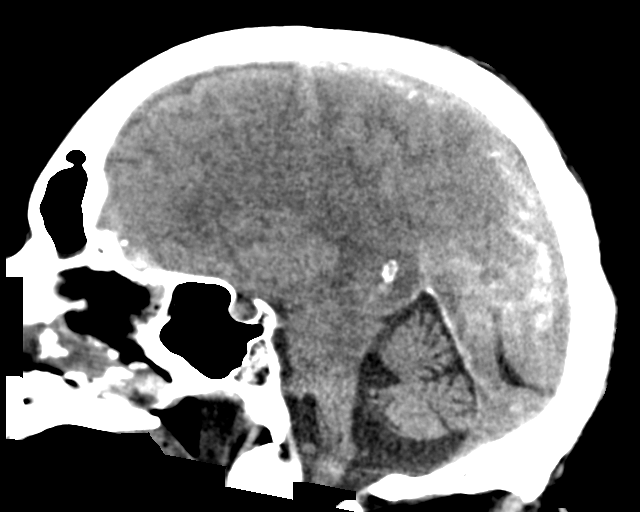
[im 37/55  brain]
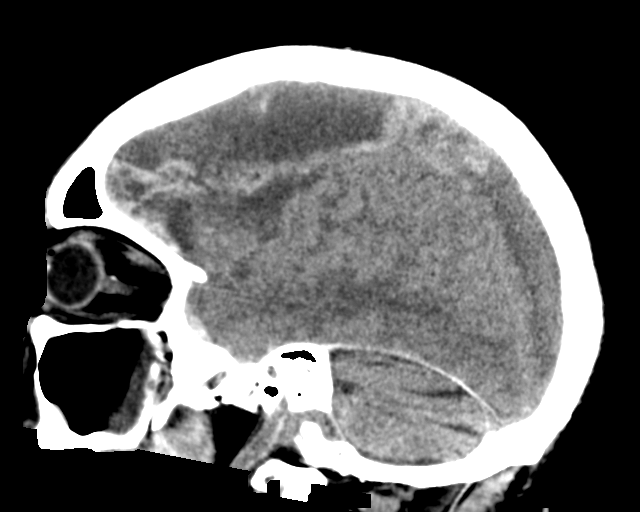

[16 of 47 positions shown; findings below may reference images not displayed]

FINDINGS: Brain: There is a large left convexity mixed density subdural
hematoma that measures up to 3.2 cm in thickness. There is rightward
subfalcine herniation with midline shift measuring 1.9 cm at the
foramina of [place]. There is entrapment of the right lateral
ventricle with dilatation of the temporal and occipital horns. Basal
cisterns remain patent. No cerebellar tonsillar herniation.

Vascular: No hyperdense vessel or unexpected vascular calcification.

Skull: Normal visualized skull base, calvarium and extracranial soft
tissues.

Sinuses/Orbits: Mild paranasal sinus mucosal disease. Normal orbits.
IMPRESSION: Large, 3.2 cm mixed density left convexity subdural hematoma with
rightward subfalcine herniation and entrapment of the right lateral
ventricle.

Critical Value/emergent results were called by telephone at the time
of interpretation on 02/23/2018 at [DATE] to Dr. MIERMANS , who verbally
acknowledged these results.
